# Patient Record
Sex: Male | Born: 1940 | Race: White | Hispanic: No | Marital: Married | Smoking: Current every day smoker
Health system: Southern US, Community
[De-identification: ages and names within clinical notes are randomized; demographics above are authoritative.]

## PROBLEM LIST (undated history)

## (undated) DIAGNOSIS — Z720 Tobacco use: Secondary | ICD-10-CM

## (undated) DIAGNOSIS — I10 Essential (primary) hypertension: Secondary | ICD-10-CM

## (undated) DIAGNOSIS — M4800 Spinal stenosis, site unspecified: Secondary | ICD-10-CM

## (undated) DIAGNOSIS — E785 Hyperlipidemia, unspecified: Secondary | ICD-10-CM

## (undated) DIAGNOSIS — I2510 Atherosclerotic heart disease of native coronary artery without angina pectoris: Secondary | ICD-10-CM

## (undated) DIAGNOSIS — E119 Type 2 diabetes mellitus without complications: Secondary | ICD-10-CM

## (undated) DIAGNOSIS — J449 Chronic obstructive pulmonary disease, unspecified: Secondary | ICD-10-CM

## (undated) HISTORY — PX: OTHER SURGICAL HISTORY: SHX169

## (undated) HISTORY — PX: CARDIAC CATHETERIZATION: SHX172

---

## 2006-03-02 ENCOUNTER — Ambulatory Visit: Payer: Self-pay | Admitting: Internal Medicine

## 2006-03-03 ENCOUNTER — Ambulatory Visit: Payer: Self-pay | Admitting: Internal Medicine

## 2006-03-19 ENCOUNTER — Inpatient Hospital Stay (HOSPITAL_COMMUNITY): Admission: RE | Admit: 2006-03-19 | Discharge: 2006-03-21 | Payer: Self-pay | Admitting: Neurosurgery

## 2007-11-29 IMAGING — CR DG CERVICAL SPINE 2 OR 3 VIEWS
1 series · 1 of 1 positions shown · non-contrast
Comparison: none

CLINICAL DATA: C3-4 HNP.  Anterior fusion. 
 CERVICAL SPINE ? 3 VIEW:

[view not recorded]
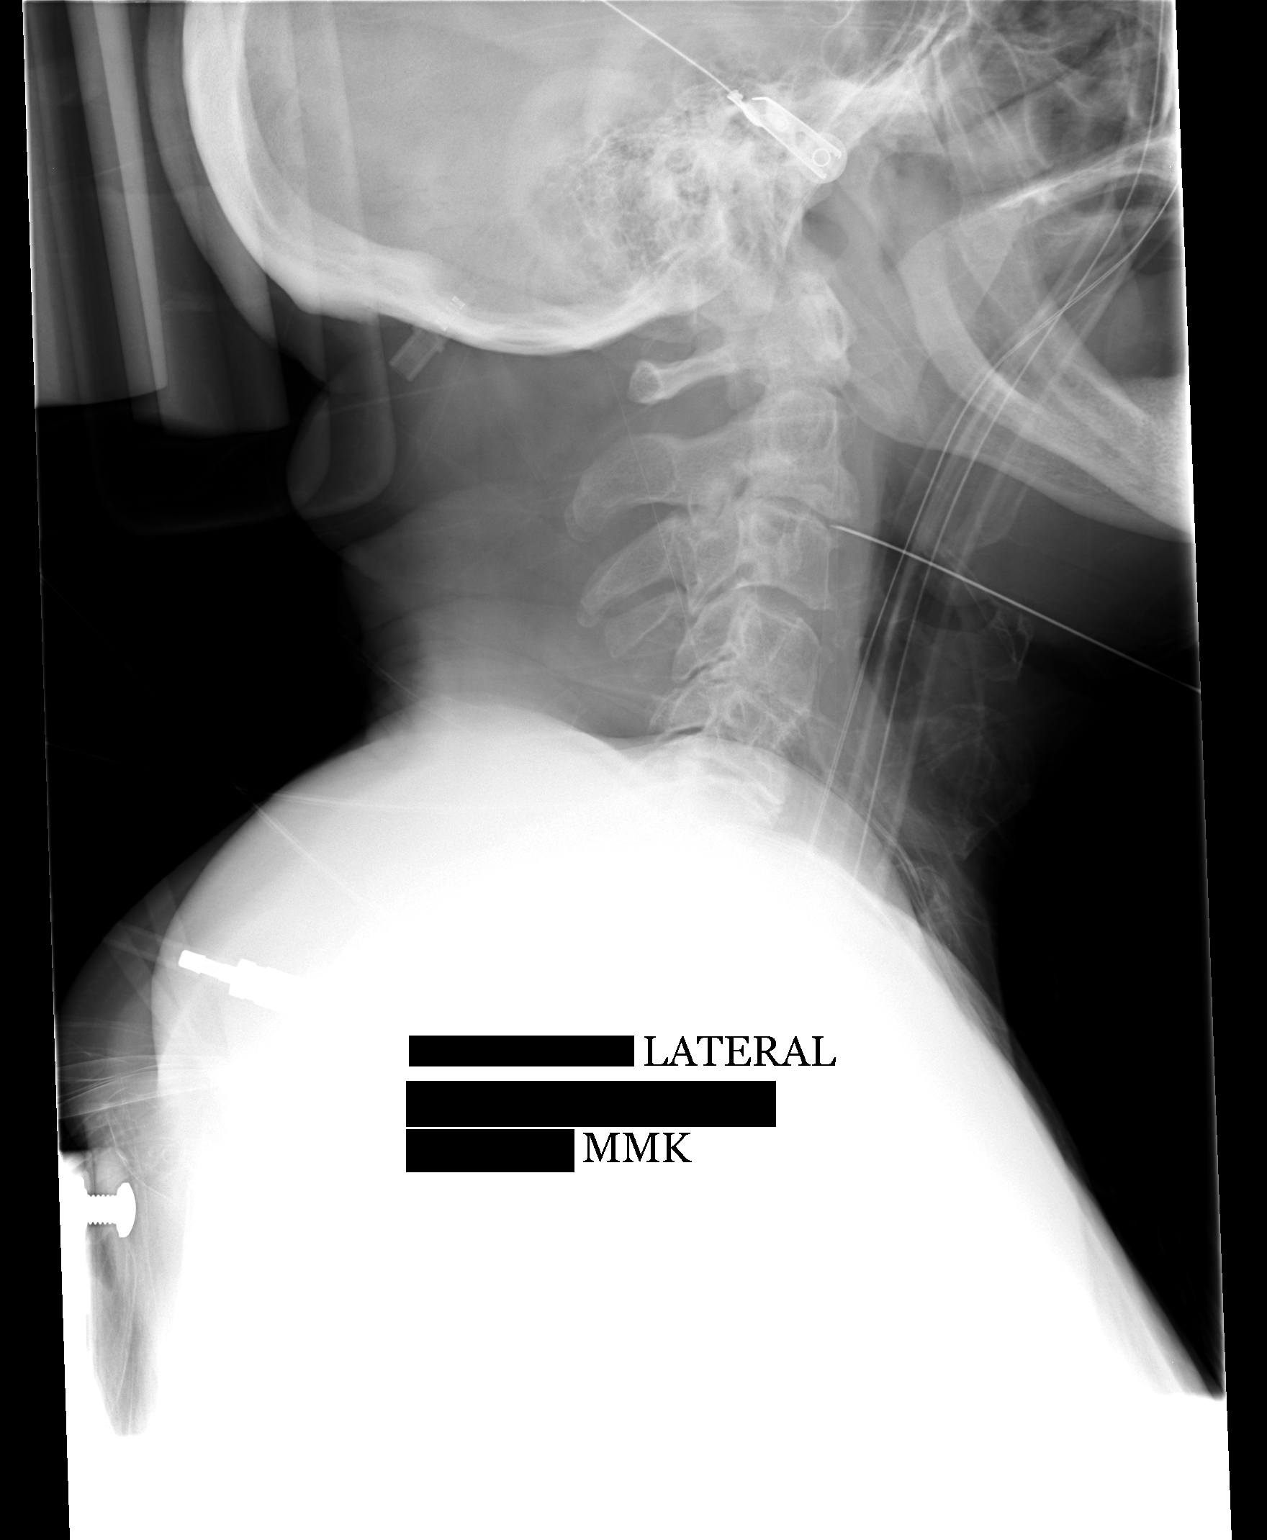

[1 of 1 positions shown; findings below may reference images not displayed]

FINDINGS: Three intraoperative lateral views of the cervical spine are submitted postoperatively for interpretation. 
 The first film demonstrates anterior probe overlying the C2-3 interspace. 
 The second film demonstrates anterior probe tip overlying the C3-4 interspace.  
 A third film demonstrates anterior plate and screw fixation and interbody fusion material at C3-4.
IMPRESSION: Anterior fusion C3-4 without definite complicating features.

## 2008-04-15 ENCOUNTER — Inpatient Hospital Stay: Payer: Self-pay | Admitting: *Deleted

## 2008-04-16 ENCOUNTER — Ambulatory Visit: Payer: Self-pay | Admitting: Internal Medicine

## 2011-05-15 ENCOUNTER — Ambulatory Visit: Payer: Self-pay | Admitting: Internal Medicine

## 2011-05-17 ENCOUNTER — Other Ambulatory Visit: Payer: Self-pay | Admitting: Internal Medicine

## 2011-05-17 LAB — CBC WITH DIFFERENTIAL/PLATELET
Basophil #: 0 10*3/uL
Basophil %: 0.4 %
Eosinophil #: 0.4 10*3/uL
Eosinophil %: 4 %
HCT: 39.1 % — ABNORMAL LOW
HGB: 13 g/dL
Lymphocyte %: 28.1 %
Lymphs Abs: 2.5 10*3/uL
MCH: 31 pg
MCHC: 33.4 g/dL
MCV: 93 fL
Monocyte #: 0.6 10*3/uL
Monocyte %: 6.6 %
Neutrophil #: 5.4 10*3/uL
Neutrophil %: 60.9 %
Platelet: 204 10*3/uL
RBC: 4.2 x10 6/mm 3 — ABNORMAL LOW
RDW: 13.4 %
WBC: 8.9 10*3/uL

## 2011-05-17 LAB — BASIC METABOLIC PANEL WITH GFR
Anion Gap: 9
BUN: 24 mg/dL — ABNORMAL HIGH
Calcium, Total: 8.9 mg/dL
Chloride: 106 mmol/L
Co2: 27 mmol/L
Creatinine: 1.75 mg/dL — ABNORMAL HIGH
EGFR (African American): 50 — ABNORMAL LOW
EGFR (Non-African Amer.): 41 — ABNORMAL LOW
Glucose: 118 mg/dL — ABNORMAL HIGH
Osmolality: 288
Potassium: 4.6 mmol/L
Sodium: 142 mmol/L

## 2011-05-18 ENCOUNTER — Ambulatory Visit: Payer: Self-pay | Admitting: Internal Medicine

## 2011-05-19 LAB — BASIC METABOLIC PANEL
Anion Gap: 10 (ref 7–16)
BUN: 17 mg/dL (ref 7–18)
Co2: 24 mmol/L (ref 21–32)
Creatinine: 1.16 mg/dL (ref 0.60–1.30)
EGFR (African American): >60
EGFR (Non-African Amer.): >60
Potassium: 4.3 mmol/L (ref 3.5–5.1)

## 2011-05-19 LAB — CK TOTAL AND CKMB (NOT AT ARMC): CK, Total: 529 U/L — ABNORMAL HIGH (ref 35–232)

## 2014-06-14 NOTE — Discharge Summary (Signed)
PATIENT NAME:  Pedro Carlson, Pedro Carlson MR#:  [id number] DATE OF BIRTH:  1940/05/10  DATE OF ADMISSION:  05/18/2011 DATE OF DISCHARGE:  05/19/2011  DISCHARGE DIAGNOSES:  1. Known coronary artery disease with unstable angina, PCI and drug-eluting stent of the left anterior descending artery.  2. Chronic renal insufficiency.   HISTORY: This is a 74 year old male with known coronary artery disease, hypertension, and hyperlipidemia having unstable angina, chest discomfort and underwent cardiac catheterization. At that time the patient had a significant left anterior descending artery stenosis which he received a drug-eluting stent without complication. He was placed on appropriate medications and watched overnight with no evidence of myocardial infarction or other EKG changes. The patient has felt quite well without any further symptoms and was ambulating well without need in adjustments of medications. He was discharged home in good condition.   DISCHARGE MEDICATIONS:  1. Metoprolol 25 mg b.i.d.  2. Aspirin 325 mg p.o. daily.  3. Metformin 1000 mg b.i.d.  4. Lisinopril 10 mg each day.  5. Simvastatin 40 mg p.o. daily.  6. Omeprazole 20 mg p.o. daily.   FOLLOW-UP: He is to follow-up with Dr. Dorothyann Pengwayne Callwood in two weeks for further treatment options and long-term cardiac care.   ____________________________ Lamar BlinksBruce J. Addisen Chappelle, MD bjk:rbg D: 06/07/2011 08:11:10 ET T: 06/08/2011 10:25:55 ET JOB#: [id number]  cc: Lamar BlinksBruce J. Chantell Kunkler, MD, <Dictator> Lamar BlinksBRUCE J Shirl Ludington MD ELECTRONICALLY SIGNED 06/13/2011 7:58

## 2014-07-24 ENCOUNTER — Emergency Department: Payer: Medicare Other

## 2014-07-24 ENCOUNTER — Inpatient Hospital Stay
Admission: EM | Admit: 2014-07-24 | Discharge: 2014-08-21 | DRG: 393 | Disposition: E | Payer: Medicare Other | Attending: Internal Medicine | Admitting: Internal Medicine

## 2014-07-24 ENCOUNTER — Encounter: Payer: Self-pay | Admitting: *Deleted

## 2014-07-24 DIAGNOSIS — G9340 Encephalopathy, unspecified: Secondary | ICD-10-CM | POA: Diagnosis not present

## 2014-07-24 DIAGNOSIS — R109 Unspecified abdominal pain: Secondary | ICD-10-CM

## 2014-07-24 DIAGNOSIS — K529 Noninfective gastroenteritis and colitis, unspecified: Secondary | ICD-10-CM | POA: Diagnosis present

## 2014-07-24 DIAGNOSIS — R001 Bradycardia, unspecified: Secondary | ICD-10-CM | POA: Diagnosis not present

## 2014-07-24 DIAGNOSIS — Z8711 Personal history of peptic ulcer disease: Secondary | ICD-10-CM | POA: Diagnosis not present

## 2014-07-24 DIAGNOSIS — N170 Acute kidney failure with tubular necrosis: Secondary | ICD-10-CM | POA: Diagnosis present

## 2014-07-24 DIAGNOSIS — I248 Other forms of acute ischemic heart disease: Secondary | ICD-10-CM | POA: Diagnosis present

## 2014-07-24 DIAGNOSIS — M4800 Spinal stenosis, site unspecified: Secondary | ICD-10-CM | POA: Diagnosis present

## 2014-07-24 DIAGNOSIS — E872 Acidosis: Secondary | ICD-10-CM | POA: Diagnosis not present

## 2014-07-24 DIAGNOSIS — K921 Melena: Secondary | ICD-10-CM | POA: Diagnosis present

## 2014-07-24 DIAGNOSIS — N183 Chronic kidney disease, stage 3 (moderate): Secondary | ICD-10-CM | POA: Diagnosis present

## 2014-07-24 DIAGNOSIS — Z79899 Other long term (current) drug therapy: Secondary | ICD-10-CM

## 2014-07-24 DIAGNOSIS — K567 Ileus, unspecified: Secondary | ICD-10-CM | POA: Diagnosis present

## 2014-07-24 DIAGNOSIS — R1110 Vomiting, unspecified: Secondary | ICD-10-CM | POA: Diagnosis present

## 2014-07-24 DIAGNOSIS — Z66 Do not resuscitate: Secondary | ICD-10-CM | POA: Diagnosis present

## 2014-07-24 DIAGNOSIS — Z7982 Long term (current) use of aspirin: Secondary | ICD-10-CM | POA: Diagnosis not present

## 2014-07-24 DIAGNOSIS — F17210 Nicotine dependence, cigarettes, uncomplicated: Secondary | ICD-10-CM | POA: Diagnosis present

## 2014-07-24 DIAGNOSIS — J441 Chronic obstructive pulmonary disease with (acute) exacerbation: Secondary | ICD-10-CM | POA: Diagnosis present

## 2014-07-24 DIAGNOSIS — A419 Sepsis, unspecified organism: Secondary | ICD-10-CM | POA: Diagnosis not present

## 2014-07-24 DIAGNOSIS — Z823 Family history of stroke: Secondary | ICD-10-CM | POA: Diagnosis not present

## 2014-07-24 DIAGNOSIS — I2510 Atherosclerotic heart disease of native coronary artery without angina pectoris: Secondary | ICD-10-CM | POA: Diagnosis present

## 2014-07-24 DIAGNOSIS — N179 Acute kidney failure, unspecified: Secondary | ICD-10-CM

## 2014-07-24 DIAGNOSIS — E119 Type 2 diabetes mellitus without complications: Secondary | ICD-10-CM | POA: Diagnosis present

## 2014-07-24 DIAGNOSIS — K559 Vascular disorder of intestine, unspecified: Secondary | ICD-10-CM | POA: Diagnosis present

## 2014-07-24 DIAGNOSIS — E785 Hyperlipidemia, unspecified: Secondary | ICD-10-CM | POA: Diagnosis present

## 2014-07-24 DIAGNOSIS — E860 Dehydration: Secondary | ICD-10-CM | POA: Diagnosis present

## 2014-07-24 DIAGNOSIS — K922 Gastrointestinal hemorrhage, unspecified: Secondary | ICD-10-CM

## 2014-07-24 DIAGNOSIS — E873 Alkalosis: Secondary | ICD-10-CM | POA: Diagnosis not present

## 2014-07-24 DIAGNOSIS — I129 Hypertensive chronic kidney disease with stage 1 through stage 4 chronic kidney disease, or unspecified chronic kidney disease: Secondary | ICD-10-CM | POA: Diagnosis present

## 2014-07-24 DIAGNOSIS — Z794 Long term (current) use of insulin: Secondary | ICD-10-CM

## 2014-07-24 DIAGNOSIS — R197 Diarrhea, unspecified: Secondary | ICD-10-CM | POA: Diagnosis present

## 2014-07-24 DIAGNOSIS — Z955 Presence of coronary angioplasty implant and graft: Secondary | ICD-10-CM

## 2014-07-24 DIAGNOSIS — R6521 Severe sepsis with septic shock: Secondary | ICD-10-CM | POA: Diagnosis not present

## 2014-07-24 DIAGNOSIS — R0602 Shortness of breath: Secondary | ICD-10-CM

## 2014-07-24 DIAGNOSIS — J9601 Acute respiratory failure with hypoxia: Secondary | ICD-10-CM | POA: Diagnosis not present

## 2014-07-24 DIAGNOSIS — R064 Hyperventilation: Secondary | ICD-10-CM

## 2014-07-24 DIAGNOSIS — R0682 Tachypnea, not elsewhere classified: Secondary | ICD-10-CM

## 2014-07-24 HISTORY — DX: Hyperlipidemia, unspecified: E78.5

## 2014-07-24 HISTORY — DX: Chronic obstructive pulmonary disease, unspecified: J44.9

## 2014-07-24 HISTORY — DX: Essential (primary) hypertension: I10

## 2014-07-24 HISTORY — DX: Tobacco use: Z72.0

## 2014-07-24 HISTORY — DX: Type 2 diabetes mellitus without complications: E11.9

## 2014-07-24 HISTORY — DX: Atherosclerotic heart disease of native coronary artery without angina pectoris: I25.10

## 2014-07-24 HISTORY — DX: Spinal stenosis, site unspecified: M48.00

## 2014-07-24 LAB — BASIC METABOLIC PANEL
ANION GAP: 19 — AB (ref 5–15)
BUN: 52 mg/dL — ABNORMAL HIGH (ref 6–20)
CALCIUM: 10.1 mg/dL (ref 8.9–10.3)
CHLORIDE: 95 mmol/L — AB (ref 101–111)
CO2: 26 mmol/L (ref 22–32)
CREATININE: 2.73 mg/dL — AB (ref 0.61–1.24)
GFR calc Af Amer: 25 mL/min — ABNORMAL LOW (ref >60)
GFR calc non Af Amer: 22 mL/min — ABNORMAL LOW (ref >60)
GLUCOSE: 250 mg/dL — AB (ref 65–99)
Potassium: 4.6 mmol/L (ref 3.5–5.1)
SODIUM: 140 mmol/L (ref 135–145)

## 2014-07-24 LAB — CBC WITH DIFFERENTIAL/PLATELET
BASOS ABS: 0 10*3/uL (ref 0–0.1)
Basophils Relative: 0 %
EOS PCT: 0 %
Eosinophils Absolute: 0 10*3/uL (ref 0–0.7)
HEMATOCRIT: 44.9 % (ref 40.0–52.0)
HEMOGLOBIN: 14.3 g/dL (ref 13.0–18.0)
Lymphocytes Relative: 16 %
Lymphs Abs: 1.4 10*3/uL (ref 1.0–3.6)
MCH: 29.3 pg (ref 26.0–34.0)
MCHC: 31.9 g/dL — AB (ref 32.0–36.0)
MCV: 91.9 fL (ref 80.0–100.0)
Monocytes Absolute: 1 10*3/uL (ref 0.2–1.0)
Monocytes Relative: 10 %
NEUTROS ABS: 6.8 10*3/uL — AB (ref 1.4–6.5)
Neutrophils Relative %: 74 %
PLATELETS: 287 10*3/uL (ref 150–440)
RBC: 4.89 MIL/uL (ref 4.40–5.90)
RDW: 14.9 % — ABNORMAL HIGH (ref 11.5–14.5)
WBC: 9.3 10*3/uL (ref 3.8–10.6)

## 2014-07-24 LAB — TROPONIN I: Troponin I: 0.08 ng/mL — ABNORMAL HIGH (ref <0.031)

## 2014-07-24 LAB — C DIFFICILE QUICK SCREEN W PCR REFLEX
C DIFFICILE (CDIFF) INTERP: NEGATIVE
C DIFFICILE (CDIFF) TOXIN: NEGATIVE
C Diff antigen: NEGATIVE

## 2014-07-24 LAB — GLUCOSE, CAPILLARY
GLUCOSE-CAPILLARY: 207 mg/dL — AB (ref 65–99)
Glucose-Capillary: 263 mg/dL — ABNORMAL HIGH (ref 65–99)

## 2014-07-24 LAB — BRAIN NATRIURETIC PEPTIDE: B NATRIURETIC PEPTIDE 5: 264 pg/mL — AB (ref 0.0–100.0)

## 2014-07-24 MED ORDER — ASPIRIN 81 MG PO CHEW
324.0000 mg | CHEWABLE_TABLET | Freq: Once | ORAL | Status: AC
  Administered 2014-07-24: 324 mg via ORAL

## 2014-07-24 MED ORDER — DIAZEPAM 2 MG PO TABS
10.0000 mg | ORAL_TABLET | Freq: Every day | ORAL | Status: DC
  Administered 2014-07-24: 23:00:00 10 mg via ORAL
  Filled 2014-07-24: qty 2

## 2014-07-24 MED ORDER — METHYLPREDNISOLONE SODIUM SUCC 125 MG IJ SOLR
60.0000 mg | INTRAMUSCULAR | Status: DC
  Administered 2014-07-24: 60 mg via INTRAVENOUS
  Filled 2014-07-24: qty 2

## 2014-07-24 MED ORDER — SIMVASTATIN 40 MG PO TABS
40.0000 mg | ORAL_TABLET | Freq: Every day | ORAL | Status: DC
  Administered 2014-07-24 – 2014-07-25 (×2): 40 mg via ORAL
  Filled 2014-07-24 (×2): qty 1

## 2014-07-24 MED ORDER — SODIUM CHLORIDE 0.9 % IV BOLUS (SEPSIS): 500.0000 mL | Freq: Once | INTRAVENOUS | Status: AC

## 2014-07-24 MED ORDER — ONDANSETRON HCL 4 MG PO TABS: 4.0000 mg | ORAL_TABLET | ORAL | Status: DC | PRN

## 2014-07-24 MED ORDER — MONTELUKAST SODIUM 10 MG PO TABS
10.0000 mg | ORAL_TABLET | Freq: Every day | ORAL | Status: DC
  Administered 2014-07-24 – 2014-07-25 (×2): 10 mg via ORAL
  Filled 2014-07-24 (×2): qty 1

## 2014-07-24 MED ORDER — LORATADINE 10 MG PO TABS
10.0000 mg | ORAL_TABLET | Freq: Every day | ORAL | Status: DC
  Administered 2014-07-24 – 2014-07-25 (×2): 10 mg via ORAL
  Filled 2014-07-24 (×3): qty 1

## 2014-07-24 MED ORDER — ONDANSETRON HCL 4 MG/2ML IJ SOLN: 4.0000 mg | Freq: Four times a day (QID) | INTRAMUSCULAR | Status: DC | PRN

## 2014-07-24 MED ORDER — PANTOPRAZOLE SODIUM 40 MG PO TBEC
40.0000 mg | DELAYED_RELEASE_TABLET | Freq: Every day | ORAL | Status: DC
  Administered 2014-07-24 – 2014-07-25 (×2): 40 mg via ORAL
  Filled 2014-07-24 (×2): qty 1

## 2014-07-24 MED ORDER — PROMETHAZINE HCL 25 MG/ML IJ SOLN
12.5000 mg | Freq: Once | INTRAMUSCULAR | Status: AC
  Administered 2014-07-24: 12.5 mg via INTRAVENOUS
  Filled 2014-07-24: qty 1

## 2014-07-24 MED ORDER — ENOXAPARIN SODIUM 30 MG/0.3ML ~~LOC~~ SOLN
30.0000 mg | SUBCUTANEOUS | Status: DC
  Administered 2014-07-24: 17:00:00 30 mg via SUBCUTANEOUS
  Filled 2014-07-24: qty 0.3

## 2014-07-24 MED ORDER — ASPIRIN 81 MG PO CHEW
CHEWABLE_TABLET | ORAL | Status: AC
  Administered 2014-07-24: 324 mg via ORAL
  Filled 2014-07-24: qty 4

## 2014-07-24 MED ORDER — LISINOPRIL 20 MG PO TABS
20.0000 mg | ORAL_TABLET | Freq: Every day | ORAL | Status: DC
  Administered 2014-07-24 – 2014-07-25 (×2): 20 mg via ORAL
  Filled 2014-07-24 (×2): qty 1

## 2014-07-24 MED ORDER — ONDANSETRON HCL 4 MG/2ML IJ SOLN
4.0000 mg | Freq: Four times a day (QID) | INTRAMUSCULAR | Status: DC | PRN
  Administered 2014-07-24: 4 mg via INTRAVENOUS
  Filled 2014-07-24: qty 2

## 2014-07-24 MED ORDER — IPRATROPIUM-ALBUTEROL 0.5-2.5 (3) MG/3ML IN SOLN
RESPIRATORY_TRACT | Status: AC
  Administered 2014-07-24: 3 mL via RESPIRATORY_TRACT
  Filled 2014-07-24: qty 3

## 2014-07-24 MED ORDER — ASPIRIN EC 81 MG PO TBEC
81.0000 mg | DELAYED_RELEASE_TABLET | Freq: Every day | ORAL | Status: DC
  Administered 2014-07-25: 10:00:00 81 mg via ORAL
  Filled 2014-07-24: qty 1

## 2014-07-24 MED ORDER — PRASUGREL HCL 10 MG PO TABS
10.0000 mg | ORAL_TABLET | Freq: Every day | ORAL | Status: DC
  Administered 2014-07-24: 17:00:00 10 mg via ORAL
  Filled 2014-07-24 (×3): qty 1

## 2014-07-24 MED ORDER — SODIUM CHLORIDE 0.9 % IV SOLN
INTRAVENOUS | Status: DC
  Administered 2014-07-24 – 2014-07-25 (×2): via INTRAVENOUS

## 2014-07-24 MED ORDER — ACETAMINOPHEN 650 MG RE SUPP: 650.0000 mg | Freq: Four times a day (QID) | RECTAL | Status: DC | PRN

## 2014-07-24 MED ORDER — INSULIN ASPART 100 UNIT/ML ~~LOC~~ SOLN
0.0000 [IU] | Freq: Every day | SUBCUTANEOUS | Status: DC
  Administered 2014-07-24: 2 [IU] via SUBCUTANEOUS
  Filled 2014-07-24: qty 2

## 2014-07-24 MED ORDER — SODIUM CHLORIDE 0.9 % IV BOLUS (SEPSIS)
500.0000 mL | Freq: Once | INTRAVENOUS | Status: AC
  Administered 2014-07-24: 500 mL via INTRAVENOUS

## 2014-07-24 MED ORDER — METOPROLOL TARTRATE 50 MG PO TABS
50.0000 mg | ORAL_TABLET | Freq: Two times a day (BID) | ORAL | Status: DC
  Administered 2014-07-24 – 2014-07-25 (×2): 50 mg via ORAL
  Filled 2014-07-24 (×2): qty 1

## 2014-07-24 MED ORDER — INSULIN GLARGINE 100 UNIT/ML ~~LOC~~ SOLN
7.0000 [IU] | Freq: Every day | SUBCUTANEOUS | Status: DC
  Administered 2014-07-24: 23:00:00 7 [IU] via SUBCUTANEOUS
  Filled 2014-07-24 (×2): qty 0.07

## 2014-07-24 MED ORDER — INSULIN ASPART 100 UNIT/ML ~~LOC~~ SOLN
0.0000 [IU] | Freq: Three times a day (TID) | SUBCUTANEOUS | Status: DC
  Administered 2014-07-24: 8 [IU] via SUBCUTANEOUS
  Administered 2014-07-25: 5 [IU] via SUBCUTANEOUS
  Administered 2014-07-25: 8 [IU] via SUBCUTANEOUS
  Filled 2014-07-24: qty 8
  Filled 2014-07-24 (×2): qty 5
  Filled 2014-07-24: qty 8

## 2014-07-24 MED ORDER — IPRATROPIUM-ALBUTEROL 0.5-2.5 (3) MG/3ML IN SOLN
3.0000 mL | Freq: Four times a day (QID) | RESPIRATORY_TRACT | Status: DC
  Administered 2014-07-24 – 2014-07-25 (×3): 3 mL via RESPIRATORY_TRACT
  Filled 2014-07-24 (×2): qty 3

## 2014-07-24 MED ORDER — ONDANSETRON HCL 4 MG PO TABS: 4.0000 mg | ORAL_TABLET | Freq: Four times a day (QID) | ORAL | Status: DC | PRN

## 2014-07-24 MED ORDER — METHYLPREDNISOLONE SODIUM SUCC 125 MG IJ SOLR
INTRAMUSCULAR | Status: AC
  Administered 2014-07-24: 125 mg via INTRAVENOUS
  Filled 2014-07-24: qty 2

## 2014-07-24 MED ORDER — IPRATROPIUM-ALBUTEROL 0.5-2.5 (3) MG/3ML IN SOLN
3.0000 mL | Freq: Once | RESPIRATORY_TRACT | Status: AC
  Administered 2014-07-24: 3 mL via RESPIRATORY_TRACT

## 2014-07-24 MED ORDER — UMECLIDINIUM-VILANTEROL 62.5-25 MCG/INH IN AEPB
1.0000 | INHALATION_SPRAY | Freq: Every day | RESPIRATORY_TRACT | Status: DC
  Administered 2014-07-25: 10:00:00 via RESPIRATORY_TRACT

## 2014-07-24 MED ORDER — LOPERAMIDE HCL 2 MG PO CAPS
2.0000 mg | ORAL_CAPSULE | Freq: Four times a day (QID) | ORAL | Status: DC | PRN
  Administered 2014-07-25: 10:00:00 2 mg via ORAL
  Filled 2014-07-24: qty 1

## 2014-07-24 MED ORDER — METHYLPREDNISOLONE SODIUM SUCC 125 MG IJ SOLR
125.0000 mg | Freq: Once | INTRAMUSCULAR | Status: AC
  Administered 2014-07-24: 125 mg via INTRAVENOUS

## 2014-07-24 MED ORDER — ACETAMINOPHEN 325 MG PO TABS
650.0000 mg | ORAL_TABLET | Freq: Four times a day (QID) | ORAL | Status: DC | PRN
Start: 2014-07-24 — End: 2014-07-26

## 2014-07-24 NOTE — ED Provider Notes (Signed)
[facility] Emergency Department Provider Note   ____________________________________________  Time seen: Arrival by EMS 11:10 AM I have reviewed the triage vital signs and the triage nursing note.  HISTORY  Chief Complaint Shortness of Breath   Historian  Patient  HPI Pedro Carlson is a 74 y.o. male who called EMS due to respiratory distress. He does have a history of COPD and uses inhalers. He had moderate reported wheezing which has improved somewhat after DuoNeb treatment by EMS prehospital. He has had 3 days of nausea vomiting and diarrhea, which has eased off. He's had no fever.    Past Medical History  Diagnosis Date  . COPD (chronic obstructive pulmonary disease)   . Diabetes mellitus without complication     There are no active problems to display for this patient.   Past Surgical History  Procedure Laterality Date  . Heart stent      Current Outpatient Rx  Name  Route  Sig  Dispense  Refill  . aspirin EC 81 MG tablet   Oral   Take 81 mg by mouth daily.         . diazepam (VALIUM) 10 MG tablet   Oral   Take 10 mg by mouth at bedtime.         Marland Kitchen. lisinopril (PRINIVIL,ZESTRIL) 20 MG tablet   Oral   Take 20 mg by mouth daily.         Marland Kitchen. loratadine (CLARITIN) 10 MG tablet   Oral   Take 10 mg by mouth daily.         . metFORMIN (GLUCOPHAGE) 1000 MG tablet   Oral   Take 1,000 mg by mouth 2 (two) times daily.         . metoprolol (LOPRESSOR) 50 MG tablet   Oral   Take 50 mg by mouth 2 (two) times daily.         . montelukast (SINGULAIR) 10 MG tablet   Oral   Take 10 mg by mouth daily.         Marland Kitchen. omeprazole (PRILOSEC) 20 MG capsule   Oral   Take 20 mg by mouth daily.         . prasugrel (EFFIENT) 10 MG TABS tablet   Oral   Take 10 mg by mouth daily.         . simvastatin (ZOCOR) 40 MG tablet   Oral   Take 40 mg by mouth daily.           Allergies Review of patient's allergies indicates no known  allergies.  History reviewed. No pertinent family history.  Social History History  Substance Use Topics  . Smoking status: Current Every Day Smoker  . Smokeless tobacco: Not on file  . Alcohol Use: Not on file    Review of Systems  Constitutional: Negative for fever. Eyes: Negative for visual changes. ENT: Negative for sore throat. Cardiovascular: Negative for chest pain. Respiratory: Negative for shortness of breath. Gastrointestinal: Positive for vomiting and diarrhea. Genitourinary: Negative for dysuria. Musculoskeletal: Negative for back pain. Skin: Negative for rash. Neurological: Negative for headaches, focal weakness or numbness.  ____________________________________________   PHYSICAL EXAM:  VITAL SIGNS: ED Triage Vitals  Enc Vitals Group     BP 11/18/2014 1109 132/86 mmHg     Pulse Rate 11/18/2014 1109 111     Resp 11/18/2014 1109 20     Temp 11/18/2014 1109 98.9 F (37.2 C)     Temp Source 11/18/2014 1109  Oral     SpO2 08/02/2014 1109 95 %     Weight 08/02/2014 1109 152 lb (68.947 kg)     Height 07/30/2014 1109 6' (1.829 m)     Head Cir --      Peak Flow --      Pain Score 08/18/2014 1110 3     Pain Loc --      Pain Edu? --      Excl. in GC? --      Constitutional: Alert and oriented. Well appearing and in no distress. Eyes: Conjunctivae are normal. PERRL. Normal extraocular movements. ENT   Head: Normocephalic and atraumatic.   Nose: No congestion/rhinnorhea.   Mouth/Throat: Mucous membranes are moist.   Neck: No stridor. Cardiovascular: Normal rate, regular rhythm.  No murmurs, rubs, or gallops. Respiratory: Mild wheezes all fields, no tachypnea or retractions. Gastrointestinal: Soft and nontender. No distention.  Genitourinary: Deferred Musculoskeletal: Nontender with normal range of motion in all extremities. No joint effusions.  No lower extremity tenderness nor edema. Neurologic:  Normal speech and language. No gross focal neurologic deficits  are appreciated. Skin:  Skin is warm, dry and intact. No rash noted. Psychiatric: Mood and affect are normal. Speech and behavior are normal. Patient exhibits appropriate insight and judgment.  ____________________________________________   EKG I, Governor Rooks, MD, the attending physician have personally viewed and interpreted this ECG.  109 bpm. Sinus tachycardia. Narrow QRS. Normal axis. Nonspecific ST-T wave. ____________________________________________  LABS (pertinent positives/negatives)  C. difficile negative BUN 52 and creatinine 2.73 Glucose 250 BNP 264 Troponin 0.08 CBC within normal limits  ____________________________________________  RADIOLOGY Radiologist results reviewed  Chest x-ray: No active pulmonary disease  __________________________________________  PROCEDURES  Procedure(s) performed: None Critical Care performed: None  ____________________________________________   ED COURSE / ASSESSMENT AND PLAN  Pertinent labs & imaging results that were available during my care of the patient were reviewed by me and considered in my medical decision making (see chart for details).  Patient clinically is having a COPD exacerbation and was treated with DuoNeb prior to arrival and I added a slight Medrol. When his troponin came back slightly elevated at 0.08, he was given 4 baby aspirins. His EKG is nonspecific. However he will need observation admission for evaluation of the shortness of breath with the abnormal EKG and troponin. He is not having any ongoing chest pain and was not given any further antibiotic relation as I suspect this is more likely a demand ischemia than acute coronary occlusion.  Patient's renal function is decreased consistent with acute renal failure in the setting of likely dehydration due to recent vomiting and diarrhea. Patient is being slowly hydrated given also possible history of congestive heart failure.  On-call hospitalist was  consulted for hospitalist admission.   ___________________________________________   FINAL CLINICAL IMPRESSION(S) / ED DIAGNOSES   Final diagnoses:  COPD with acute exacerbation  Acute renal failure, unspecified acute renal failure type      Governor Rooks, MD 07/26/2014 1342

## 2014-07-24 NOTE — Progress Notes (Signed)
Dr. Clint GuyHower notified of patients nausea not improved after PRN zofran. MD to enter new order for Phenergan, please see orders.

## 2014-07-24 NOTE — ED Notes (Signed)
Pt changed and cleaned.

## 2014-07-24 NOTE — ED Notes (Signed)
Pt arrives via EMS with complaints of SOB, N/V x 3 days, pt states an episode of diaherra today, EMS gave 1 duoneb and 4mg  Zofran, pt awake and alert upon arrival

## 2014-07-24 NOTE — ED Notes (Signed)
Pt had large episode of liquid stool, pt cleaned and sample collected, bed changed, Dr. Shaune PollackLord notified

## 2014-07-24 NOTE — Plan of Care (Signed)
Problem: Discharge Progression Outcomes Goal: Discharge plan in place and appropriate Individualization Outcome: Progressing Address patient as [name]. Patient is from home, lives with wife. Patient is a smoker. Smokes 1/2 pack of cigarettes daily. Patient is a high falls risk, offer toileting every hour. Patient has history of COPD, DM, HTN and CAD controlled with home medication. Goal: Other Discharge Outcomes/Goals Outcome: Progressing Plan of Care Progressing to Goal: Patient has no complaints of pain. Receiving IV fluids. Tolerated diet well. Continues with diarrhea.

## 2014-07-24 NOTE — ED Notes (Signed)
Dr. Shaune PollackLord notified of trop of 0.08

## 2014-07-24 NOTE — H&P (Signed)
[facility] [place] at [facility]   PATIENT NAME: Pedro Carlson    MR#:  [id number]  DATE OF BIRTH:  Mar 10, 1940  DATE OF ADMISSION:  07/28/2014  PRIMARY CARE PHYSICIAN: Jaclyn Shaggy, MD   REQUESTING/REFERRING PHYSICIAN: Governor Rooks  CHIEF COMPLAINT:   Chief Complaint  Patient presents with  . Shortness of Breath  Diarrhea HISTORY OF PRESENT ILLNESS:  JAQUESE Carlson is a 74 y.o. Male with HTN, DM, CAD, COPD, Tobacco abuse who called EMS due to respiratory distress. He does have a history of COPD and uses inhalers. He had moderate reported wheezing which has improved somewhat after DuoNeb treatment by EMS prehospital. He has had 2 days of nausea vomiting and diarrhea. He's had no fever but had chills. No blood in vomiting or diarrhea. 3 episodes of watery diarrhea here in the emergency room.   breathing is improved after Solu-Medrol and DuoNeb.  Found to have acute renal failure with severe dehydration from his vomiting and diarrhea. No recent antibiotics use. Mild on and off abdominal pain, presently pain-free. C. difficile checked in the emergency room and negative.  PAST MEDICAL HISTORY:   Past Medical History  Diagnosis Date  . COPD (chronic obstructive pulmonary disease)   . Diabetes mellitus without complication   . Hypertension   . CAD (coronary artery disease)   . Spinal stenosis   . Tobacco abuse   . Hyperlipidemia     PAST SURGICAL HISTORY:   Past Surgical History  Procedure Laterality Date  . Heart stent    . Cardiac catheterization      SOCIAL HISTORY:   History  Substance Use Topics  . Smoking status: Current Every Day Smoker    Types: Cigarettes  . Smokeless tobacco: Not on file  . Alcohol Use: No    FAMILY HISTORY:   Family History  Problem Relation Age of Onset  . CVA Father     DRUG ALLERGIES:  No Known Allergies  REVIEW OF SYSTEMS:   Review of Systems  Constitutional: Positive for chills and  malaise/fatigue. Negative for fever and weight loss.  HENT: Negative for hearing loss and nosebleeds.   Eyes: Negative for blurred vision, double vision and pain.  Respiratory: Positive for cough, shortness of breath and wheezing. Negative for hemoptysis and sputum production.   Cardiovascular: Negative for chest pain, palpitations, orthopnea and leg swelling.  Gastrointestinal: Positive for nausea, vomiting, abdominal pain and diarrhea. Negative for constipation.  Genitourinary: Negative for dysuria and hematuria.  Musculoskeletal: Negative for myalgias, back pain and falls.  Skin: Negative for rash.  Neurological: Negative for dizziness, tremors, sensory change, speech change, focal weakness, seizures and headaches.  Endo/Heme/Allergies: Does not bruise/bleed easily.  Psychiatric/Behavioral: Negative for depression and memory loss. The patient is not nervous/anxious.     MEDICATIONS AT HOME:   Prior to Admission medications   Medication Sig Start Date End Date Taking? Authorizing Provider  albuterol (PROVENTIL HFA;VENTOLIN HFA) 108 (90 BASE) MCG/ACT inhaler Inhale 1 puff into the lungs every 6 (six) hours as needed for shortness of breath.   Yes Historical Provider, MD  aspirin EC 81 MG tablet Take 81 mg by mouth daily.   Yes Historical Provider, MD  diazepam (VALIUM) 10 MG tablet Take 10 mg by mouth at bedtime.   Yes Historical Provider, MD  insulin glargine (LANTUS) 100 UNIT/ML injection Inject 7 Units into the skin at bedtime.   Yes Historical Provider, MD  lisinopril (PRINIVIL,ZESTRIL) 20 MG tablet Take 20 mg  by mouth daily.   Yes Historical Provider, MD  loratadine (CLARITIN) 10 MG tablet Take 10 mg by mouth daily.   Yes Historical Provider, MD  metFORMIN (GLUCOPHAGE) 1000 MG tablet Take 1,000 mg by mouth 2 (two) times daily.   Yes Historical Provider, MD  metoprolol (LOPRESSOR) 50 MG tablet Take 50 mg by mouth 2 (two) times daily.   Yes Historical Provider, MD  montelukast  (SINGULAIR) 10 MG tablet Take 10 mg by mouth daily.   Yes Historical Provider, MD  omeprazole (PRILOSEC) 20 MG capsule Take 20 mg by mouth daily.   Yes Historical Provider, MD  prasugrel (EFFIENT) 10 MG TABS tablet Take 10 mg by mouth daily.   Yes Historical Provider, MD  simvastatin (ZOCOR) 40 MG tablet Take 40 mg by mouth daily.   Yes Historical Provider, MD  Umeclidinium-Vilanterol 62.5-25 MCG/INH AEPB Inhale 1 puff into the lungs daily.   Yes Historical Provider, MD      VITAL SIGNS:  Blood pressure 103/60, pulse 105, temperature 98.9 F (37.2 C), temperature source Oral, resp. rate 40, height 6' (1.829 m), weight 68.947 kg (152 lb), SpO2 95 %.  PHYSICAL EXAMINATION:  Physical Exam  GENERAL:  74 y.o.-year-old patient lying in the bed with no acute distress.  EYES: Pupils equal, round, reactive to light and accommodation. No scleral icterus. Extraocular muscles intact.  HEENT: Head atraumatic, normocephalic. Oropharynx and nasopharynx clear. No oropharyngeal erythema, Dry oral mucosa NECK:  Supple, no jugular venous distention. No thyroid enlargement, no tenderness.  LUNGS: Normal breath sounds bilaterally, rales, rhonchi. No use of accessory muscles of respiration. Expiratory weezing. CARDIOVASCULAR: S1, S2 normal. No murmurs, rubs, or gallops.  ABDOMEN: Soft, nondistended. Bowel sounds present. No organomegaly or mass. Mild diffuse tenderness. EXTREMITIES: No pedal edema, cyanosis, or clubbing. + 2 pedal & radial pulses b/l.   NEUROLOGIC: Cranial nerves II through XII are intact. No focal Motor or sensory deficits appreciated b/l PSYCHIATRIC: The patient is alert and oriented x 3. Good affect.  SKIN: No obvious rash, lesion, or ulcer.   LABORATORY PANEL:   CBC  Recent Labs Lab 08/11/2014 1132  WBC 9.3  HGB 14.3  HCT 44.9  PLT 287   ------------------------------------------------------------------------------------------------------------------  Chemistries   Recent  Labs Lab 08/15/2014 1132  NA 140  K 4.6  CL 95*  CO2 26  GLUCOSE 250*  BUN 52*  CREATININE 2.73*  CALCIUM 10.1   ------------------------------------------------------------------------------------------------------------------  Cardiac Enzymes  Recent Labs Lab 08/01/2014 1132  TROPONINI 0.08*   ------------------------------------------------------------------------------------------------------------------  RADIOLOGY:  Dg Chest 2 View  08/18/2014   CLINICAL DATA:  Shortness of breath.  EXAM: CHEST  2 VIEW  COMPARISON:  None.  FINDINGS: The heart size and mediastinal contours are within normal limits. Both lungs are clear. No pneumothorax or pleural effusion is noted. 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  IMPRESSION: No active cardiopulmonary disease.   Electronically Signed   By: Lupita RaiderJames  Green Jr, M.D.   On: 08/18/2014 13:02     IMPRESSION AND PLAN:   * Acute renal failure, pre renal from dehydration Due to vomiting, diarrhea and decreased intake. Bolus 500 mL of normal saline now. Put him on continuous IV fluid resuscitation. Monitor input and output. We will repeat a BUN and creatinine tomorrow morning. He does have a baseline CKD stage III and baseline creatinine is 1.4-1.5.  * Gastroenteritis  C. difficile is negative. We will send for stool cultures. No acute abdomen signs. Will need further investigation  if symptoms don't resolve over the next 24-48 hours.  * COPD exacerbation  improved with IV steroid-induced and nebulizers in the emergency room will continue him on daily steroids, nebulizers and home inhalers.  * Hypertension  continue home medications  * Diabetes mellitus Lantus, SSI, ADA.  * Coronary artery disease 0.08 troponin. Repeat troponin. No Chest pain or EKG changes. Likely due to demand ischemia in setting of COPD exacerbation and  ARF.  * DVT prophylaxis Lovenox, renally dosed  * Tobacco abuse Counseled to quit  smoking for greater than 3 minutes. Patient is not keen on quitting at this point.   All the records are reviewed and case discussed with ED provider. Management plans discussed with the patient, family and they are in agreement.  CODE STATUS: DNI. Ok to Resuscitate.   TOTAL TIME TAKING CARE OF THIS PATIENT: 45 minutes.    Milagros Loll R M.D on 08/05/14 at 2:46 PM  Between 7am to 6pm - Pager - [phone]  After 6pm go to [email or web address] - password [email or web address] [facility]  [facility]  Office  [phone]  CC: Primary care physician; Jaclyn Shaggy, MD

## 2014-07-25 ENCOUNTER — Inpatient Hospital Stay: Payer: Medicare Other

## 2014-07-25 DIAGNOSIS — K559 Vascular disorder of intestine, unspecified: Secondary | ICD-10-CM

## 2014-07-25 LAB — TROPONIN I: Troponin I: 0.14 ng/mL — ABNORMAL HIGH (ref <0.031)

## 2014-07-25 LAB — CBC
HEMATOCRIT: 39.9 % — AB (ref 40.0–52.0)
Hemoglobin: 12.8 g/dL — ABNORMAL LOW (ref 13.0–18.0)
MCH: 29.3 pg (ref 26.0–34.0)
MCHC: 32 g/dL (ref 32.0–36.0)
MCV: 91.6 fL (ref 80.0–100.0)
Platelets: 258 10*3/uL (ref 150–440)
RBC: 4.36 MIL/uL — ABNORMAL LOW (ref 4.40–5.90)
RDW: 14.7 % — ABNORMAL HIGH (ref 11.5–14.5)
WBC: 12.2 10*3/uL — ABNORMAL HIGH (ref 3.8–10.6)

## 2014-07-25 LAB — BASIC METABOLIC PANEL
ANION GAP: 19 — AB (ref 5–15)
BUN: 88 mg/dL — AB (ref 6–20)
CALCIUM: 8.8 mg/dL — AB (ref 8.9–10.3)
CHLORIDE: 92 mmol/L — AB (ref 101–111)
CO2: 26 mmol/L (ref 22–32)
Creatinine, Ser: 3.6 mg/dL — ABNORMAL HIGH (ref 0.61–1.24)
GFR calc Af Amer: 18 mL/min — ABNORMAL LOW (ref >60)
GFR, EST NON AFRICAN AMERICAN: 15 mL/min — AB (ref >60)
Glucose, Bld: 276 mg/dL — ABNORMAL HIGH (ref 65–99)
Potassium: 4.2 mmol/L (ref 3.5–5.1)
SODIUM: 137 mmol/L (ref 135–145)

## 2014-07-25 LAB — BLOOD GAS, ARTERIAL
ACID-BASE DEFICIT: 10.6 mmol/L — AB (ref 0.0–2.0)
ALLENS TEST (PASS/FAIL): POSITIVE — AB
Bicarbonate: 17.9 mEq/L — ABNORMAL LOW (ref 21.0–28.0)
FIO2: 100 %
O2 Saturation: 99.8 %
PATIENT TEMPERATURE: 37
PO2 ART: 268 mmHg — AB (ref 83.0–108.0)
pCO2 arterial: 49 mmHg — ABNORMAL HIGH (ref 32.0–48.0)
pH, Arterial: 7.17 — CL (ref 7.350–7.450)

## 2014-07-25 LAB — GLUCOSE, CAPILLARY
GLUCOSE-CAPILLARY: 261 mg/dL — AB (ref 65–99)
Glucose-Capillary: 220 mg/dL — ABNORMAL HIGH (ref 65–99)
Glucose-Capillary: 194 mg/dL — ABNORMAL HIGH (ref 65–99)
Glucose-Capillary: 203 mg/dL — ABNORMAL HIGH (ref 65–99)
Glucose-Capillary: 100 mg/dL — ABNORMAL HIGH (ref 65–99)
Glucose-Capillary: 110 mg/dL — ABNORMAL HIGH (ref 65–99)

## 2014-07-25 LAB — HEMOGLOBIN: HEMOGLOBIN: 12.3 g/dL — AB (ref 13.0–18.0)

## 2014-07-25 LAB — MRSA PCR SCREENING: MRSA BY PCR: NEGATIVE

## 2014-07-25 LAB — OCCULT BLOOD X 1 CARD TO LAB, STOOL: FECAL OCCULT BLD: POSITIVE — AB

## 2014-07-25 LAB — LACTIC ACID, PLASMA: Lactic Acid, Venous: 3.9 mmol/L (ref 0.5–2.0)

## 2014-07-25 LAB — LIPASE, BLOOD: LIPASE: 26 U/L (ref 22–51)

## 2014-07-25 MED ORDER — SODIUM CHLORIDE 0.9 % IV BOLUS (SEPSIS)
500.0000 mL | Freq: Once | INTRAVENOUS | Status: AC
  Administered 2014-07-25: 500 mL via INTRAVENOUS

## 2014-07-25 MED ORDER — PHENYLEPHRINE HCL 10 MG/ML IJ SOLN
0.0000 ug/min | INTRAVENOUS | Status: DC
  Administered 2014-07-25: 8 ug/min via INTRAVENOUS
  Filled 2014-07-25: qty 1

## 2014-07-25 MED ORDER — SODIUM CHLORIDE 0.9 % IV BOLUS (SEPSIS)
1000.0000 mL | INTRAVENOUS | Status: AC | PRN
  Administered 2014-07-25: 1000 mL via INTRAVENOUS

## 2014-07-25 MED ORDER — STERILE WATER FOR INJECTION IV SOLN
INTRAVENOUS | Status: DC
  Administered 2014-07-25: 16:00:00 via INTRAVENOUS
  Filled 2014-07-25 (×4): qty 850

## 2014-07-25 MED ORDER — VASOPRESSIN 20 UNIT/ML IV SOLN
0.0300 [IU]/min | INTRAVENOUS | Status: DC
  Administered 2014-07-25: 0.03 [IU]/min via INTRAVENOUS
  Filled 2014-07-25: qty 2

## 2014-07-25 MED ORDER — NOREPINEPHRINE 4 MG/250ML-% IV SOLN
INTRAVENOUS | Status: AC
  Administered 2014-07-25: 2 ug/min via INTRAVENOUS
  Filled 2014-07-25: qty 250

## 2014-07-25 MED ORDER — LORAZEPAM 2 MG/ML IJ SOLN
INTRAMUSCULAR | Status: AC
  Administered 2014-07-25: 03:00:00
  Filled 2014-07-25: qty 1

## 2014-07-25 MED ORDER — NOREPINEPHRINE 4 MG/250ML-% IV SOLN
0.0000 ug/min | INTRAVENOUS | Status: DC
  Administered 2014-07-25: 40 ug/min via INTRAVENOUS
  Administered 2014-07-25: 2 ug/min via INTRAVENOUS
  Administered 2014-07-25: 25 ug/min via INTRAVENOUS
  Filled 2014-07-25 (×2): qty 250

## 2014-07-25 MED ORDER — SODIUM CHLORIDE 0.9 % IV BOLUS (SEPSIS)
1000.0000 mL | Freq: Once | INTRAVENOUS | Status: AC
  Administered 2014-07-25: 12:00:00 1000 mL via INTRAVENOUS

## 2014-07-25 MED ORDER — IOHEXOL 240 MG/ML SOLN
25.0000 mL | INTRAMUSCULAR | Status: AC
Start: 2014-07-25 — End: 2014-07-25

## 2014-07-25 MED ORDER — SODIUM CHLORIDE 0.9 % IV SOLN
INTRAVENOUS | Status: DC
  Administered 2014-07-25: 12:00:00 via INTRAVENOUS

## 2014-07-25 MED ORDER — CHLORHEXIDINE GLUCONATE 0.12 % MT SOLN: 15.0000 mL | Freq: Two times a day (BID) | OROMUCOSAL | Status: DC

## 2014-07-25 MED ORDER — PIPERACILLIN-TAZOBACTAM 3.375 G IVPB
3.3750 g | Freq: Two times a day (BID) | INTRAVENOUS | Status: DC
  Administered 2014-07-25: 3.375 g via INTRAVENOUS
  Filled 2014-07-25 (×2): qty 50

## 2014-07-25 MED ORDER — NOREPINEPHRINE BITARTRATE 1 MG/ML IV SOLN: 0.0000 ug/min | INTRAVENOUS | Status: DC

## 2014-07-25 MED ORDER — MORPHINE SULFATE 2 MG/ML IJ SOLN
INTRAMUSCULAR | Status: AC
  Administered 2014-07-25: 2 mg via INTRAVENOUS
  Filled 2014-07-25: qty 1

## 2014-07-25 MED ORDER — NOREPINEPHRINE BITARTRATE 1 MG/ML IV SOLN
0.0000 ug/min | INTRAVENOUS | Status: DC
  Administered 2014-07-25: 30 ug/min via INTRAVENOUS
  Filled 2014-07-25: qty 16

## 2014-07-25 MED ORDER — HYDROCODONE-ACETAMINOPHEN 5-325 MG PO TABS
1.0000 | ORAL_TABLET | ORAL | Status: DC | PRN
  Administered 2014-07-25: 1 via ORAL
  Filled 2014-07-25: qty 1

## 2014-07-25 MED ORDER — MORPHINE SULFATE 2 MG/ML IJ SOLN
2.0000 mg | INTRAMUSCULAR | Status: DC | PRN
  Administered 2014-07-25 (×2): 2 mg via INTRAVENOUS
  Filled 2014-07-25: qty 1

## 2014-07-25 MED ORDER — LORAZEPAM 2 MG/ML IJ SOLN
2.0000 mg | Freq: Once | INTRAMUSCULAR | Status: AC
  Administered 2014-07-25: 03:00:00 2 mg via INTRAVENOUS

## 2014-07-25 MED ORDER — CETYLPYRIDINIUM CHLORIDE 0.05 % MT LIQD
7.0000 mL | Freq: Two times a day (BID) | OROMUCOSAL | Status: DC
  Administered 2014-07-25: 7 mL via OROMUCOSAL

## 2014-07-25 MED ORDER — SODIUM CHLORIDE 0.9 % IV SOLN: INTRAVENOUS | Status: DC

## 2014-07-26 NOTE — Outcomes Assessment (Signed)
HRdrpooed Pt is cynotic pt was unresponsive. Code called. Emergency meds given CPR started.Md at bed side.pt with no spontinous  resprations auidble heart sounds and no palpable pulse. Pt prounced death at 22.20. [facility] was called Spoke to [employer] was denied. [phone]#[id number].cleaned and send to morgue.

## 2014-07-27 MED FILL — Medication: Qty: 1 | Status: AC

## 2014-07-28 LAB — STOOL CULTURE

## 2014-08-04 NOTE — Discharge Summary (Signed)
[facility] Physicians -  at [facility]   PATIENT NAME: Pedro Carlson    MR#:  [id number]  DATE OF BIRTH:  Jun 03, 1940  DATE OF ADMISSION:  07/23/2014 ADMITTING PHYSICIAN: Milagros Loll, MD  DATE OF DISCHARGE: 07/26/2014  2:50 AM  PRIMARY CARE PHYSICIAN: Jaclyn Shaggy, MD    ADMISSION DIAGNOSIS:  COPD with acute exacerbation [J44.1] Acute renal failure, unspecified acute renal failure type [N17.9]  DISCHARGE DIAGNOSIS:  Active Problems:   ARF (acute renal failure)   Diarrhea   Dehydration   COPD exacerbation   Vomiting   Gastroenteritis   SECONDARY DIAGNOSIS:   Past Medical History  Diagnosis Date  . COPD (chronic obstructive pulmonary disease)   . Diabetes mellitus without complication   . Hypertension   . CAD (coronary artery disease)   . Spinal stenosis   . Tobacco abuse   . Hyperlipidemia      ADMITTING HISTORY  Pedro Carlson is a 74 y.o. Male with HTN, DM, CAD, COPD, Tobacco abuse who called EMS due to respiratory distress. He does have a history of COPD and uses inhalers. He had moderate reported wheezing which has improved somewhat after DuoNeb treatment by EMS prehospital. He has had 2 days of nausea vomiting and diarrhea. He's had no fever but had chills. No blood in vomiting or diarrhea. 3 episodes of watery diarrhea here in the emergency room.  breathing is improved after Solu-Medrol and DuoNeb.  Found to have acute renal failure with severe dehydration from his vomiting and diarrhea. No recent antibiotics use. Mild on and off abdominal pain, presently pain-free. C. difficile checked in the emergency room and negative.   HOSPITAL COURSE:   # Acute renal failure due to ATN #  Septic shock #  COPD exacerbation #  Gastroenteritis #  Acute encephalopathy #  Elevated troponin due to demand ischemia #  Hypertension #  Diabetes mellitus #  Tobacco abuse #  GI bleed #  Acute respiratory failure, hypoxic  # severe metabolic  acidosis  # bradycardia   please refer to detailed progress note previously. The patient was admitted to the hospital initially for possible gastroenteritis and COPD exacerbation. His abdominal examination was benign all through. Later on 22-Aug-2014 patient deteriorated quickly. He started having multiple bouts of diarrhea which were bloody. GI was consulted. The patient was supposed to have a CT scan of the abdomen but with further deterioration and being unstable could not get the imaging study. Was transferred to ICU. Start on broad-spectrum antibiotics. Surgery was consulted but the patient was not thought to be a surgical candidate by Dr. Michela Pitcher.  Patient needed pressor support. As per patient's wishes he was not intubated and later changed to do not resuscitate by family. With worsening condition and no response to aggressive therapy all.  The patient had to be pronounced dead at 22.20 PM on 08/22/2014.  CONSULTS OBTAINED:  Treatment Team:  Mosetta Pigeon, MD Tiney Rouge III, MD Dr. Marva Panda DRUG ALLERGIES:  No Known Allergies  DISCHARGE MEDICATIONS:   Discharge Medication List as of 07/26/2014  3:02 AM    CONTINUE these medications which have NOT CHANGED   Details  albuterol (PROVENTIL HFA;VENTOLIN HFA) 108 (90 BASE) MCG/ACT inhaler Inhale 1 puff into the lungs every 6 (six) hours as needed for shortness of breath., Until Discontinued, Historical Med    aspirin EC 81 MG tablet Take 81 mg by mouth daily., Until Discontinued, Historical Med    diazepam (VALIUM) 10 MG tablet Take  10 mg by mouth at bedtime., Until Discontinued, Historical Med    insulin glargine (LANTUS) 100 UNIT/ML injection Inject 7 Units into the skin at bedtime., Until Discontinued, Historical Med    lisinopril (PRINIVIL,ZESTRIL) 20 MG tablet Take 20 mg by mouth daily., Until Discontinued, Historical Med    loratadine (CLARITIN) 10 MG tablet Take 10 mg by mouth daily., Until Discontinued, Historical Med    metFORMIN  (GLUCOPHAGE) 1000 MG tablet Take 1,000 mg by mouth 2 (two) times daily., Until Discontinued, Historical Med    metoprolol (LOPRESSOR) 50 MG tablet Take 50 mg by mouth 2 (two) times daily., Until Discontinued, Historical Med    montelukast (SINGULAIR) 10 MG tablet Take 10 mg by mouth daily., Until Discontinued, Historical Med    omeprazole (PRILOSEC) 20 MG capsule Take 20 mg by mouth daily., Until Discontinued, Historical Med    prasugrel (EFFIENT) 10 MG TABS tablet Take 10 mg by mouth daily., Until Discontinued, Historical Med    simvastatin (ZOCOR) 40 MG tablet Take 40 mg by mouth daily., Until Discontinued, Historical Med    Umeclidinium-Vilanterol 62.5-25 MCG/INH AEPB Inhale 1 puff into the lungs daily., Until Discontinued, Historical Med             PHYSICAL EXAMINATION:  Physical Exam   DATA REVIEW:   CBC No results for input(s): WBC, HGB, HCT, PLT in the last 168 hours.  Chemistries  No results for input(s): NA, K, CL, CO2, GLUCOSE, BUN, CREATININE, CALCIUM, MG, AST, ALT, ALKPHOS, BILITOT in the last 168 hours.  Invalid input(s): GFRCGP  Cardiac Enzymes No results for input(s): TROPONINI in the last 168 hours.  Microbiology Results  Results for orders placed or performed during the hospital encounter of 08/06/2014  C difficile quick scan w PCR reflex [facility])     Status: None   Collection Time: 07/26/2014 11:36 AM  Result Value Ref Range Status   C Diff antigen NEGATIVE  Final   C Diff toxin NEGATIVE  Final   C Diff interpretation Negative for C. difficile  Final  Stool culture     Status: None   Collection Time: 2014/08/19 12:01 AM  Result Value Ref Range Status   Specimen Description STOOL  Final   Special Requests NONE  Final   Culture   Final    NO SALMONELLA OR SHIGELLA ISOLATED No Pathogenic E. coli detected NO CAMPYLOBACTER DETECTED    Report Status 07/28/2014 FINAL  Final  MRSA PCR Screening     Status: None   Collection Time: 08/19/14  6:07 PM   Result Value Ref Range Status   MRSA by PCR NEGATIVE NEGATIVE Final    Comment:        The GeneXpert MRSA Assay (FDA approved for NASAL specimens only), is one component of a comprehensive MRSA colonization surveillance program. It is not intended to diagnose MRSA infection nor to guide or monitor treatment for MRSA infections.     RADIOLOGY:  No results found.  CODE STATUS:  Advance Directive Documentation        Most Recent Value   Type of Advance Directive  Living will   Pre-existing out of facility DNR order (yellow form or pink MOST form)     "MOST" Form in Place?         Milagros Loll R M.D on 08/04/2014 at 2:07 AM  Between 7am to 6pm - Pager - [phone]  After 6pm go to [email or web address] - Scientist, research (life sciences) Hospitalists  Office  [phone]  CC: Primary care physician; Albina Billet, MD

## 2014-08-12 NOTE — Progress Notes (Signed)
VS taken by Pilar Plate, RN

## 2014-08-21 NOTE — Progress Notes (Signed)
ANTIBIOTIC CONSULT NOTE - INITIAL  Pharmacy Consult for Zosyn Indication: Intra-abdominal infection  No Known Allergies  Patient Measurements: Height: 6' (182.9 cm) Weight: 142 lb 3.2 oz (64.501 kg) IBW/kg (Calculated) : 77.6   Vital Signs: Temp: 97.7 F (36.5 C) (06/04 1328) Temp Source: Axillary (06/04 1328) BP: 155/127 mmHg (06/04 1645) Pulse Rate: 46 (06/04 1530) Intake/Output from previous day: 06/03 0701 - 06/04 0700 In: 1641.7 [P.O.:240; I.V.:1401.7] Out: 1 [Stool:1] Intake/Output from this shift: Total I/O In: 240 [P.O.:240] Out: 10 [Urine:2; Stool:8]  Labs:  Recent Labs  08/14/2014 1132 May 28, 2014 0507 May 28, 2014 1353  WBC 9.3 12.2*  --   HGB 14.3 12.8* 12.3*  PLT 287 258  --   CREATININE 2.73* 3.60*  --    Estimated Creatinine Clearance: 16.7 mL/min (by C-G formula based on Cr of 3.6). No results for input(s): VANCOTROUGH, VANCOPEAK, VANCORANDOM, GENTTROUGH, GENTPEAK, GENTRANDOM, TOBRATROUGH, TOBRAPEAK, TOBRARND, AMIKACINPEAK, AMIKACINTROU, AMIKACIN in the last 72 hours.   Microbiology: Recent Results (from the past 720 hour(s))  C difficile quick scan w PCR reflex [facility])     Status: None   Collection Time: 08/14/2014 11:36 AM  Result Value Ref Range Status   C Diff antigen NEGATIVE  Final   C Diff toxin NEGATIVE  Final   C Diff interpretation Negative for C. difficile  Final    Medical History: Past Medical History  Diagnosis Date  . COPD (chronic obstructive pulmonary disease)   . Diabetes mellitus without complication   . Hypertension   . CAD (coronary artery disease)   . Spinal stenosis   . Tobacco abuse   . Hyperlipidemia     Assessment: 74 yo male starting on Zosyn for possible intra-abdominal infection  Goal of Therapy:  Resolution of infection  Plan:  Based on CrCL <20 ml/min, will order Zosyn 3.375 g IV q12h EI  Marty HeckWang, Lorian Yaun L 07/27/2014,4:46 PM

## 2014-08-21 NOTE — Plan of Care (Signed)
Problem: Discharge Progression Outcomes Goal: Discharge plan in place and appropriate Individualization  Outcome: Progressing Address patient as Pedro Carlson. Patient is from home, lives with wife. Patient is a smoker. Smokes 1/2 pack of cigarettes daily. Patient is a high falls risk, offer toileting every hour. Patient has history of COPD, DM, HTN and CAD controlled with home medication.    Goal: Other Discharge Outcomes/Goals Outcome: Progressing Plan of care progress to goals: Discharge: return to home with wife Pain: c/o generalized back pain, improved with PRN pain medication Hemodynamic: WBC 9.3; IVF infusing per MD order; VSS Complications: c/o nausea, not relieved with zofran. Dr. Clint GuyHower notified new order for Phenergan 12.5mg  IV once obtained, pt reported some improvement. Pt also restless, new order for Ativan 2mg  IV once entered by Dr. Clint GuyHower with no improvement noted. Pt more restless and agitated.  Tolerating diet: patients nausea has improved as shift progressed, requesting coffee and no further c/o nausea since phenergan given.  Activity: Pt continues to be weak, [facility] assist.  Wife is at bedside, pt is pleasantly confused with increased agitation and restlessness. Patient has had 1loose BM this shift, stool  sample sent per MD order, awaiting results.

## 2014-08-21 NOTE — Progress Notes (Signed)
Verbal order from skulskie, order 3 way imaging of abdomen stat

## 2014-08-21 NOTE — Consult Note (Signed)
Pedro Carlson is a 74 y.o. male  admitted earlier this weekend with an exacerbation of his chronic respiratory insufficiency. He was noted to be severely dehydrated with elevated creatinine  HPI: He was admitted with acute on chronic respiratory embarrassment was noted to be severely dehydrated secondary to nausea and vomiting. His creatinine was elevated. He was gently rehydrated but developed some significant diarrhea. His diarrhea was guaiac positive. He was seen by the GI service and set up for Clostridium difficile and stool culture studies. He suffered a significant respiratory embarrassment this afternoon was transferred urgently the intensive care unit. There is developed significant abdominal distention hypotension and respiratory insufficiency. Plain films were largely unremarkable demonstrating an ileus. Blood gases demonstrated severe acidosis. He is on a rebreathing mask at the present time and supported by multiple pressors. The surgical service was asked to see him with regard to possible ischemic bowel injury versus an acute abdomen.   Past Medical History  Diagnosis Date  . COPD (chronic obstructive pulmonary disease)   . Diabetes mellitus without complication   . Hypertension   . CAD (coronary artery disease)   . Spinal stenosis   . Tobacco abuse   . Hyperlipidemia    Past Surgical History  Procedure Laterality Date  . Heart stent    . Cardiac catheterization     History   Social History  . Marital Status: Married    Spouse Name: N/A  . Number of Children: N/A  . Years of Education: N/A   Social History Main Topics  . Smoking status: Current Every Day Smoker -- 0.50 packs/day    Types: Cigarettes  . Smokeless tobacco: Not on file  . Alcohol Use: No  . Drug Use: No  . Sexual Activity: Not on file   Other Topics Concern  . None   Social History Narrative  . None    Review of Systems: ROSis impossible current situation with the patient's respiratory status  and altered mental status   PHYSICAL EXAM: BP 62/41 mmHg  Pulse 46  Temp(Src) 97.7 F (36.5 C) (Axillary)  Resp 33  Ht 6' (1.829 m)  Wt 64.501 kg (142 lb 3.2 oz)  BMI 19.28 kg/m2  SpO2 70%  Physical Exam  general: He is not responsive breathing 30-40 times a minute.  HEENT: He has no obvious facial deformities he does not follow commands with his eyes and does not have any evidence of scleral icterus.  Lymph: He has no cervical or axillary lymphadenopathy.  Lungs: He is very distant breath sounds with minimal pulmonary excursion and tubular breath sounds bilaterally. There is some distant wheezing.  Cardiac: He does not appear to have any gallop rhythms or murmurs but his rate is so fast it is very difficult to tell. I cannot appreciate any irregular rhythms.  Abdomen: Is markedly distended tympanitic abdomen is very quiet with only a very few bowel sounds. He does not have any abdominal tenderness but does not respond to abdominal examination.  Lower extremity exam reveals no significant deformities but he has no distal pulses and significant mottling.  Neurologic exam: Not possible and the current situation.  Psych exam: Not possible and the current situation.  Skin: His multiple areas of mottling without obvious contusions or bruises.   Impression I have independently reviewed his lab work and his abdominal and chest x-rays. He is not stable enough to undergo an abdominal CT scan. I suspect that he developed significant volume contraction due to his dehydration and  developed significant bowel ischemia either thrombotic or merely a low flow state. The diarrhea probably suggested significant mucosal ischemia. He does not have any evidence of bowel perforation on plain films but I suspect he probably does have severely ischemic bowel or possibly even necrotic bowel at this point. The opportunity for surgical intervention reversal has passed as this is gentleman is critically ill and  certainly not a surgical candidate. His bowel ischemia is most likely secondary to his underlying dehydration and renal insufficiency and respiratory embarrassment.    Tiney Rouge III, MD  07/28/2014, 6:41 PM

## 2014-08-21 NOTE — Progress Notes (Signed)
I was called to see this patient who was just transferred to the CCU. Patient is currently obtunded. His wife and family members are present. The family wants to revert his DNR. I have explained to them that this is not possible as he was lucid yesterday and had the conversation with Dr.Sudini. He also has a living will that states he would not any life saving measures.    Patient was brought to CCU as he had sudden decline in status. He is a DNI which was confirmed yesterday by Dr. Elpidio AnisSudini. His wife says he would have CPR and chest compressions/defbirillation if needed.  He had several black tarry stools this am and episodes of vomiting. He is still having some streaky bloody stools. His CXR and ABD XRAY does not show fluid overload or free air. IVF were stopped earlier due to the concern of fluid overload. His ABG shows a significant resp alkalosis. His hgb is stable at 12. His BP is low 85/54 and HR is 117.O2 95% on 100% NRB He is on a NRB and as mentioned obtunded. Pale appearing  CVS: tachy no m/g/r LUNGS: decreased breath sounds with no crackles ABD: soft, NT/ND hyperactive BS EXT no edema   LABS HGB 12 Ph 7.1 PCO2 49  A/P 74 y/o male who initially presented with diarrhea and COPD exacerbation who unfortunately has clinically declined in mental and respiratory status. He also has bloody bowel movements.  1. Acute respiratory failure: Patient initially presented with COPD exacerbation and now does not have wheezing on examination. He has acute respiratory alkalosis but is DO NOT INTUBATE. I will speak with respiratory therapy to see if CPAP may help, but the concern is his inability to protect his airway.  2. GI bleed: It is unclear at this point if this is from his upper GI or lower GI. More likely it is upper GI as he apparently he has had dark melanotic stools. GI is following. The plan is for abdominal CAT scan once the patient is more stable.  3. Hypovolemic shock: Patient's  blood pressure is low. I suspect his hypovolemia could be GI related. I will provide IV fluids. If the IV fluids do not help we will start pressors. Goal MEP is greater than 65.  4. Acute renal failure: I suspect this is ATN related to his GI bleed and persistent diarrhea and vomiting. Nephrology is consulted. At this point I do not feel the patient is fluid overloaded, he may be dehydrated. I will start IV fluids after a bolus of fluids. We will monitor the creatinine carefully.  5. Coronary artery disease: Patient had a slightly elevated troponin this is likely secondary to demand ischemia in the setting of the above issues.  Patient is critically ill. I have spoken with the family in great detail as well as the GI physician.    Critical care time spent 65 minutes

## 2014-08-21 NOTE — Progress Notes (Signed)
Dr Elpidio AnisSudini notified that patient is having frequent dark stools, hgb has dropped from previous day and breathing is labored. MD would like patient to get IV fluid bolus and okay to send stool for occult blood. Telephone order.

## 2014-08-21 NOTE — Progress Notes (Addendum)
Pt admitted for ARF, BUN and creatinine worsening, US renal performed, nephrology consult entered. Frequent loose dark maroon stools throughout shift, Dr. Elpidio AnisSudini notified and placed order for abdomen CT, hgb draw at 14:00, and gastro consult.  Pt having labored breathing throughout shift, Dr. Elpidio AnisSudini notified and chest xray ordered, IV fluids stopped. After Chest xray IV fluids were reinstated.     Dr. Thedore MinsSingh consulted with patient and ordered for pt to transfer to ccu due to difficulties of breathing. Prior to transfer Dr. Marva PandaSkulskie consulted with patient and performed rectal exam, during consutlation pt became cold and grey in color, o2 saturation was 75% on room air, pt placed on hiflo and transferred to CCU, [facility]   CT abdomen not performed prior to transfer bc patietn refused to drink contrast, Dr. Marva PandaSkulskie notified and ordered abdominal xray. Wife at bedside at time of transfer.

## 2014-08-21 NOTE — Progress Notes (Signed)
Chaplain responds to code, family in crisis as patient's conditions worsens. Chaplain provided spiritual presence, offered prayer and encouragement. Kaleen Maskhaplain Raymond D. Clovis RileyMitchell Saturday 08/19/2014   Feb 23, 2014 1332  Clinical Encounter Type  Visited With Patient and family together  Visit Type Initial;Spiritual support;Code  Referral From Nurse;Physician  Consult/Referral To Chaplain  Spiritual Encounters  Spiritual Needs Prayer;Emotional  Stress Factors  Patient Stress Factors Health changes;Major life changes  Family Stress Factors Family relationships;Health changes

## 2014-08-21 NOTE — Progress Notes (Signed)
[facility] - [place] at [facility]   PATIENT NAME: Pedro IgoWilliam Carlson    MR#:  [id number]  DATE OF BIRTH:  February 08, 1941  SUBJECTIVE:  CHIEF COMPLAINT:   Chief Complaint  Patient presents with  . Shortness of Breath  Vomiting/Diarrhea   Has been restless and confused overnight. Still has diarrhea. No vomiting. No abdominal pain. Low urine Output. Afebrile. REVIEW OF SYSTEMS:    ROS Confused  DRUG ALLERGIES:  No Known Allergies  VITALS:  Blood pressure 112/64, pulse 96, temperature 98 F (36.7 C), temperature source Oral, resp. rate 18, height 6' (1.829 m), weight 64.501 kg (142 lb 3.2 oz), SpO2 98 %.  PHYSICAL EXAMINATION:   Physical Exam  GENERAL:  74 y.o.-year-old patient lying in the bed with resp distress. Confused. EYES: Pupils equal, round, reactive to light and accommodation. No scleral icterus. Extraocular muscles intact.  HEENT: Head atraumatic, normocephalic. Oropharynx and nasopharynx clear. Dry oral mucosa. NECK:  Supple, no jugular venous distention. No thyroid enlargement, no tenderness.  LUNGS:Increased work of breathing. BB&T CorporationWeezing. Crackles right lower lobe. CARDIOVASCULAR: S1, S2 normal. No murmurs, rubs, or gallops.  ABDOMEN: Soft, nontender, nondistended. Bowel sounds present. No organomegaly or mass. Mild per-umbilical tenderness. EXTREMITIES: No cyanosis, clubbing or edema b/l.    NEUROLOGIC: Cranial nerves II through XII are intact. No focal Motor or sensory deficits b/l.   PSYCHIATRIC: The patient is alert and oriented x 3.  SKIN: No obvious rash, lesion, or ulcer.    LABORATORY PANEL:   CBC  Recent Labs Lab 10-29-2014 0507  WBC 12.2*  HGB 12.8*  HCT 39.9*  PLT 258   ------------------------------------------------------------------------------------------------------------------  Chemistries   Recent Labs Lab 10-29-2014 0507  NA 137  K 4.2  CL 92*  CO2 26  GLUCOSE 276*  BUN 88*  CREATININE 3.60*  CALCIUM 8.8*    ------------------------------------------------------------------------------------------------------------------  Cardiac Enzymes  Recent Labs Lab 08/16/2014 1132  TROPONINI 0.08*   ------------------------------------------------------------------------------------------------------------------  RADIOLOGY:  Dg Chest 2 View  08/11/2014   CLINICAL DATA:  Shortness of breath.  EXAM: CHEST  2 VIEW  COMPARISON:  None.  FINDINGS: The heart size and mediastinal contours are within normal limits. Both lungs are clear. No pneumothorax or pleural effusion is noted. 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  IMPRESSION: No active cardiopulmonary disease.   Electronically Signed   By: Lupita RaiderJames  Green Jr, M.D.   On: 08/17/2014 13:02     ASSESSMENT AND PLAN:   * Acute renal failure, pre renal from dehydration Due to vomiting, diarrhea and decreased intake. BUN/Cr worse inspite of IV fluid resuscitation. Check renal US. Consult nephrology. Obstruction? Held IVF till CXR to r/o pulmonary edema Check lactic acid. AG -19  * Gastroenteritis C. difficile is negative. We will send for stool cultures. No acute abdomen signs. Will need further investigation if symptoms don't resolve over the next 24-48 hours.  * COPD exacerbation Steroids, nebs.  * Hypertension continue home medications  * Diabetes mellitus Lantus, SSI, ADA.  * Coronary artery disease 0.08 troponin. Repeat troponin. No Chest pain or EKG changes. Likely due to demand ischemia in setting of COPD exacerbation and ARF.  * DVT prophylaxis Lovenox, renally dosed  * Tobacco abuse Counseled at admission  Critically ill with high risk for cardiac arrest and death  All the records are reviewed and case discussed with Care Management/Social Workerr. Management plans discussed with the patient, family and they are in agreement.  CODE STATUS: DNI/ OK TO RESUSCITATE  DVT Prophylaxis: Lovenox  TOTAL  TIME TAKING CARE OF THIS PATIENT: 40 minutes.   POSSIBLE D/C IN 2-3 DAYS, DEPENDING ON CLINICAL CONDITION.   Milagros Loll R M.D on 08-05-14 at 9:27 AM  Between 7am to 6pm - Pager - [phone]  After 6pm go to [email or web address] - password [email or web address] [facility]  [place] Hospitalists  Office  [phone]  CC: Primary care physician; Jaclyn Shaggy, MD

## 2014-08-21 NOTE — Progress Notes (Signed)
Dr. Clint GuyHower notified of patients restlessness and continued unresolved nausea; Also, wife states that she forgot to list on patients home medication list that he takes Hydrocodone for arthritis pain. MD to enter order for Hydrocodone and ativan one time dose. See new orders.

## 2014-08-21 NOTE — Consult Note (Signed)
Date: 07/23/2014                  Patient Name:  Pedro Carlson  MRN: [id number]  DOB: [birth year]  Age / Sex: 74 y.o., male         PCP: Jaclyn ShaggyATE,DENNY C, MD                 Service Requesting Consult: Internal medicine                 Reason for Consult: ARF            History of Present Illness: Patient is a 74 y.o. male with medical problems of HTN, DM-2, CAD, COPD, Tobacco abuse, who was admitted to [facility] on 08/14/2014 for evaluation of resp distress.  At present, patient is somewhat confused. He is tachypneic. No able to provide detailed information. All information is obtained for the chart and patient's wife.  Baseline Cr not available. Last known cr is 1.16  Admit Cr 2.73 Today 3.60  Renal u/s neg for obstruction bit shows small kidneys and abnormal appearance of liver  Patient is tachypneic, cold clammy extremities He is having loose stools which are reported to be black  Medications: Outpatient medications: Prescriptions prior to admission  Medication Sig Dispense Refill Last Dose  . albuterol (PROVENTIL HFA;VENTOLIN HFA) 108 (90 BASE) MCG/ACT inhaler Inhale 1 puff into the lungs every 6 (six) hours as needed for shortness of breath.   PRN at PRN  . aspirin EC 81 MG tablet Take 81 mg by mouth daily.   07/23/2014 at Unknown time  . diazepam (VALIUM) 10 MG tablet Take 10 mg by mouth at bedtime.   07/23/2014 at Unknown time  . insulin glargine (LANTUS) 100 UNIT/ML injection Inject 7 Units into the skin at bedtime.   07/23/2014 at Unknown time  . lisinopril (PRINIVIL,ZESTRIL) 20 MG tablet Take 20 mg by mouth daily.   07/23/2014 at Unknown time  . loratadine (CLARITIN) 10 MG tablet Take 10 mg by mouth daily.   07/23/2014 at Unknown time  . metFORMIN (GLUCOPHAGE) 1000 MG tablet Take 1,000 mg by mouth 2 (two) times daily.   07/23/2014 at Unknown time  . metoprolol (LOPRESSOR) 50 MG tablet Take 50 mg by mouth 2 (two) times daily.   07/23/2014 at 2100  . montelukast (SINGULAIR) 10 MG tablet  Take 10 mg by mouth daily.   07/23/2014 at Unknown time  . omeprazole (PRILOSEC) 20 MG capsule Take 20 mg by mouth daily.   07/23/2014 at Unknown time  . prasugrel (EFFIENT) 10 MG TABS tablet Take 10 mg by mouth daily.   07/23/2014 at Unknown time  . simvastatin (ZOCOR) 40 MG tablet Take 40 mg by mouth daily.   07/23/2014 at Unknown time  . Umeclidinium-Vilanterol 62.5-25 MCG/INH AEPB Inhale 1 puff into the lungs daily.   07/23/2014 at Unknown time    Current medications: Current Facility-Administered Medications  Medication Dose Route Frequency Provider Last Rate Last Dose  . 0.9 %  sodium chloride infusion   Intravenous Continuous Adrian SaranSital Mody, MD      . acetaminophen (TYLENOL) tablet 650 mg  650 mg Oral Q6H PRN Milagros LollSrikar Sudini, MD       Or  . acetaminophen (TYLENOL) suppository 650 mg  650 mg Rectal Q6H PRN Milagros LollSrikar Sudini, MD      . aspirin EC tablet 81 mg  81 mg Oral Daily Milagros LollSrikar Sudini, MD   81 mg at 08/15/2014 0959  .  diazepam (VALIUM) tablet 10 mg  10 mg Oral QHS Milagros Loll, MD   10 mg at 2014-07-28 2242  . HYDROcodone-acetaminophen (NORCO/VICODIN) 5-325 MG per tablet 1 tablet  1 tablet Oral Q4H PRN Wyatt Haste, MD   1 tablet at 07/28/2014 0347  . insulin aspart (novoLOG) injection 0-15 Units  0-15 Units Subcutaneous TID WC Milagros Loll, MD   5 Units at 07/28/2014 1220  . insulin aspart (novoLOG) injection 0-5 Units  0-5 Units Subcutaneous QHS Milagros Loll, MD   2 Units at 07-28-14 2241  . insulin glargine (LANTUS) injection 7 Units  7 Units Subcutaneous QHS Milagros Loll, MD   7 Units at Jul 28, 2014 2241  . ipratropium-albuterol (DUONEB) 0.5-2.5 (3) MG/3ML nebulizer solution 3 mL  3 mL Nebulization Q6H Srikar Sudini, MD   3 mL at 07/23/2014 0825  . loperamide (IMODIUM) capsule 2 mg  2 mg Oral Q6H PRN Milagros Loll, MD   2 mg at 08/03/2014 0959  . loratadine (CLARITIN) tablet 10 mg  10 mg Oral Daily Milagros Loll, MD   10 mg at 08/18/2014 1219  . methylPREDNISolone sodium succinate (SOLU-MEDROL) 125 mg/2 mL  injection 60 mg  60 mg Intravenous Q24H Milagros Loll, MD   60 mg at 07-28-14 2345  . metoprolol (LOPRESSOR) tablet 50 mg  50 mg Oral BID Milagros Loll, MD   50 mg at 08/17/2014 0959  . montelukast (SINGULAIR) tablet 10 mg  10 mg Oral Daily Milagros Loll, MD   10 mg at 08/19/2014 0959  . morphine 2 MG/ML injection 2 mg  2 mg Intravenous Q2H PRN Milagros Loll, MD   2 mg at 07/27/2014 1353  . ondansetron (ZOFRAN) tablet 4 mg  4 mg Oral Q4H PRN Wyatt Haste, MD       Or  . ondansetron [facility]) injection 4 mg  4 mg Intravenous Q6H PRN Wyatt Haste, MD      . pantoprazole (PROTONIX) EC tablet 40 mg  40 mg Oral Daily Milagros Loll, MD   40 mg at 08/04/2014 0959  . simvastatin (ZOCOR) tablet 40 mg  40 mg Oral Daily Milagros Loll, MD   40 mg at 08/15/2014 0959  . sodium chloride 0.9 % bolus 1,000 mL  1,000 mL Intravenous PRN Adrian Saran, MD   1,000 mL at 07/23/2014 1417  . Umeclidinium-Vilanterol 62.5-25 MCG/INH AEPB 1 puff  1 puff Inhalation Daily Milagros Loll, MD          Allergies: No Known Allergies    Past Medical History: Past Medical History  Diagnosis Date  . COPD (chronic obstructive pulmonary disease)   . Diabetes mellitus without complication   . Hypertension   . CAD (coronary artery disease)   . Spinal stenosis   . Tobacco abuse   . Hyperlipidemia      Past Surgical History: Past Surgical History  Procedure Laterality Date  . Heart stent    . Cardiac catheterization       Family History: Family History  Problem Relation Age of Onset  . CVA Father      Social History: History   Social History  . Marital Status: Married    Spouse Name: N/A  . Number of Children: N/A  . Years of Education: N/A   Occupational History  . Not on file.   Social History Main Topics  . Smoking status: Current Every Day Smoker -- 0.50 packs/day    Types: Cigarettes  . Smokeless tobacco: Not on file  . Alcohol Use:  No  . Drug Use: No  . Sexual Activity: Not on file   Other Topics  Concern  . Not on file   Social History Narrative  . No narrative on file     Review of Systems: unreliable.   Vital Signs: Blood pressure 98/52, pulse 83, temperature 97.7 F (36.5 C), temperature source Axillary, resp. rate 41, height 6' (1.829 m), weight 64.501 kg (142 lb 3.2 oz), SpO2 63 %.  Weight trends: Filed Weights   08/12/2014 1109 08/02/2014 1605 2014-08-07 0300  Weight: 68.947 kg (152 lb) 67.405 kg (148 lb 9.6 oz) 64.501 kg (142 lb 3.2 oz)    Physical Exam: General:  ill appearing  HEENT Anicteric, moist mucus membranes  Neck:  supple  Lungs: Crackles b/l, tachypneic  Heart:  tachycardic,   Abdomen: Soft, non tender  Extremities:  trace edema  Neurologic: Confused, follows some commands  Skin: No acute rashes             Lab results: Basic Metabolic Panel:  Recent Labs Lab 08/16/2014 1132 2014-08-07 0507  NA 140 137  K 4.6 4.2  CL 95* 92*  CO2 26 26  GLUCOSE 250* 276*  BUN 52* 88*  CREATININE 2.73* 3.60*  CALCIUM 10.1 8.8*    Liver Function Tests: No results for input(s): AST, ALT, ALKPHOS, BILITOT, PROT, ALBUMIN in the last 168 hours. No results for input(s): LIPASE, AMYLASE in the last 168 hours. No results for input(s): AMMONIA in the last 168 hours.  CBC:  Recent Labs Lab 08/18/2014 1132 2014-08-07 0507 August 07, 2014 1353  WBC 9.3 12.2*  --   NEUTROABS 6.8*  --   --   HGB 14.3 12.8* 12.3*  HCT 44.9 39.9*  --   MCV 91.9 91.6  --   PLT 287 258  --     Cardiac Enzymes:  Recent Labs Lab 2014-08-07 0933  TROPONINI 0.14*    BNP: Invalid input(s): POCBNP  CBG:  Recent Labs Lab 08/09/2014 2159 2014/08/07 0743 2014-08-07 1104 08/07/14 1310 08-07-14 1327  GLUCAP 207* 261* 220* 203* 194*    Microbiology: Results for orders placed or performed during the hospital encounter of 07/27/2014  C difficile quick scan w PCR reflex [facility])     Status: None   Collection Time: 08/01/2014 11:36 AM  Result Value Ref Range Status   C Diff antigen NEGATIVE   Final   C Diff toxin NEGATIVE  Final   C Diff interpretation Negative for C. difficile  Final    Coagulation Studies: No results for input(s): LABPROT, INR in the last 72 hours.  Urinalysis: No results for input(s): COLORURINE, LABSPEC, PHURINE, GLUCOSEU, HGBUR, BILIRUBINUR, KETONESUR, PROTEINUR, UROBILINOGEN, NITRITE, LEUKOCYTESUR in the last 72 hours.  Invalid input(s): APPERANCEUR    Imaging: Dg Chest 2 View  07/28/2014   CLINICAL DATA:  Shortness of breath.  EXAM: CHEST  2 VIEW  COMPARISON:  None.  FINDINGS: The heart size and mediastinal contours are within normal limits. Both lungs are clear. No pneumothorax or pleural effusion is noted. 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  IMPRESSION: No active cardiopulmonary disease.   Electronically Signed   By: Lupita Raider, M.D.   On: 08/11/2014 13:02   US Renal  08/07/14   CLINICAL DATA:  Acute renal failure.  EXAM: RENAL / URINARY TRACT ULTRASOUND COMPLETE  COMPARISON:  None.  FINDINGS: Right Kidney:  Length: 7.7 cm. Echogenicity within normal limits. No mass or hydronephrosis visualized.  Left Kidney:  Length: 8.5 cm. Echogenicity within normal limits. No mass or hydronephrosis visualized.  Bladder:  Appears normal for degree of bladder distention.  Visualized portions of the liver show are heterogeneous in echotexture with areas of increased echogenicity. The etiology is unclear. Consider followup liver imaging. Cysts is noted in the visualized spleen along its inferior margin.  IMPRESSION: 1. Relatively small kidneys. Normal parenchymal echogenicity. No renal masses. No hydronephrosis. 2. Abnormal appearance of the liver. Consider follow-up CT scan of the abdomen with contrast for further evaluation versus liver MRI with and without contrast.   Electronically Signed   By: Amie Portland M.D.   On: 07/27/2014 11:52   Dg Chest Port 1 View  07/23/2014   CLINICAL DATA:  Shortness of breath.  History of COPD.  EXAM:  PORTABLE CHEST - 1 VIEW  COMPARISON:  08/03/2014  FINDINGS: Cardiac silhouette normal in size and configuration. No mediastinal or hilar masses or evidence of adenopathy.  Lungs hyperexpanded but clear. No pleural effusion or pneumothorax. Bony thorax is grossly intact.  IMPRESSION: 1. No acute cardiopulmonary disease. 2. COPD.   Electronically Signed   By: Amie Portland M.D.   On: 08/11/2014 11:46      Assessment & Plan: Patient is a 74 y.o. male with medical problems of HTN, DM-2, CAD, COPD, Tobacco abuse, who was admitted to [facility] on 08-03-14 for evaluation of resp distress.  1. ARF - likely ATN - avoid hypotension - Electrolytes and Volume status are acceptable No acute indication for Dialysis at present but may need soon  2. Pulmonary edema - avoid iv fluids - may need pressors  3. Lactic Acidosis - underlying cause likely ischemic  - GI eval in progress

## 2014-08-21 NOTE — Progress Notes (Signed)
PT arrived to CCU from [facility].

## 2014-08-21 NOTE — Op Note (Signed)
[facility]   PROCEDURE NOTE  PROCEDURE: 1. Right femoral central venous catheter placement 2. Right femoral vein cannulation under ultrasound guidance  PRE-OPERATIVE DIAGNOSIS: Acute respiratory failure with likely respiratory collapse, acute renal failure, COPD exacerbation  POST-OPERATIVE DIAGNOSIS: same as above  SURGEON: Festus BarrenJason Saylor Murry, MD  ANESTHESIA:  None  ESTIMATED BLOOD LOSS: minimal  FINDING(S): Right jugular vein very flat and difficult to cannulate with respiratory issues. Right femoral vein widely patent and line easily placed.  SPECIMEN(S):  none  INDICATIONS:   Pedro Carlson is a 74 y.o. male who presents with need for venous access.  The patient presents for central venous catheter placement.  The patients family is aware the risks of central venous catheter placement include but are not limited to: bleeding, infection, central venous injury, pneumothorax, possible venous stenosis, possible malpositioning in the venous system, and possible infections related to long-term catheter presence. The patient was aware of these risks and agreed to proceed.  DESCRIPTION: After written informed consent was obtained from the patient and/or family, the patient was placed supine in the hospital bed.  The patient was prepped with chloraprep and draped in the standard fashion for a chest or neck central venous catheter placement.  I anesthesized the neck cannulation site with 1% lidocaine but the vein was extremely flat and even in Trendelenburg the vein was not easily accessed with the patient's severe respiratory issues, BiPAP, and tachypnea. It was clear this was going to be a difficult and potentially dangerous situation, so I turned my attention to the right femoral vein. The right groin was sterilely prepped and draped. I then anesthetized the area with 1% lidocaine and then under ultrasound guidance, the right femoral  vein was cannulated with the 18 gauge  needle.  A J wire was then placed.  After a skin nick and dilatation, the triple lumen central venous catheter was placed over the wire and the wire was removed.  Each port was aspirated and flushed with sterile normal saline.  The catheter was secured in placed with three interrupted stitches of 3-0 Silk tied to the catheter.  The catheter was dressed with sterile dressing.  Stat CXR is pending  COMPLICATIONS: none apparent  CONDITION: stable  Pedro Carlson 08/10/2014, 3:31 PM

## 2014-08-21 NOTE — Consult Note (Signed)
[facility] Vascular Consult Note  MRN : [id number]  Pedro Carlson is a 74 y.o. (11-25-1940) male who presents with chief complaint of  Chief Complaint  Patient presents with  . Shortness of Breath  .   History of Present Illness: Patient transferred to the critical care unit for worsening hypoxia and shortness of breath. He was admitted yesterday with COPD exacerbation. He is a DO NOT INTUBATE, and currently has sats in the 60s and 70s on BiPAP. He has very tenuous venous access and the family is now requesting aggressive care so we are asked to place a central line. He is obtunded and unable to provide any history, and this is obtained from the previous medical records. He apparently is quite dehydrated and has been having diarrhea continuously for a couple of weeks.  Current Facility-Administered Medications  Medication Dose Route Frequency Provider Last Rate Last Dose  . 0.9 %  sodium chloride infusion   Intravenous Continuous Adrian SaranSital Mody, MD      . acetaminophen (TYLENOL) tablet 650 mg  650 mg Oral Q6H PRN Milagros LollSrikar Sudini, MD       Or  . acetaminophen (TYLENOL) suppository 650 mg  650 mg Rectal Q6H PRN Srikar Sudini, MD      . antiseptic oral rinse (CPC / CETYLPYRIDINIUM CHLORIDE 0.05%) solution 7 mL  7 mL Mouth Rinse q12n4p Sital Mody, MD      . aspirin EC tablet 81 mg  81 mg Oral Daily Milagros LollSrikar Sudini, MD   81 mg at October 12, 2014 0959  . chlorhexidine (PERIDEX) 0.12 % solution 15 mL  15 mL Mouth Rinse BID Sital Mody, MD      . diazepam (VALIUM) tablet 10 mg  10 mg Oral QHS Milagros LollSrikar Sudini, MD   10 mg at 08/05/2014 2242  . HYDROcodone-acetaminophen (NORCO/VICODIN) 5-325 MG per tablet 1 tablet  1 tablet Oral Q4H PRN Wyatt Hasteavid K Hower, MD   1 tablet at October 12, 2014 0347  . insulin aspart (novoLOG) injection 0-15 Units  0-15 Units Subcutaneous TID WC Milagros LollSrikar Sudini, MD   5 Units at October 12, 2014 1220  . insulin aspart (novoLOG) injection 0-5 Units  0-5 Units Subcutaneous QHS Milagros LollSrikar  Sudini, MD   2 Units at 08/01/2014 2241  . insulin glargine (LANTUS) injection 7 Units  7 Units Subcutaneous QHS Milagros LollSrikar Sudini, MD   7 Units at 08/13/2014 2241  . ipratropium-albuterol (DUONEB) 0.5-2.5 (3) MG/3ML nebulizer solution 3 mL  3 mL Nebulization Q6H Srikar Sudini, MD   3 mL at October 12, 2014 0825  . loperamide (IMODIUM) capsule 2 mg  2 mg Oral Q6H PRN Milagros LollSrikar Sudini, MD   2 mg at October 12, 2014 0959  . loratadine (CLARITIN) tablet 10 mg  10 mg Oral Daily Milagros LollSrikar Sudini, MD   10 mg at October 12, 2014 1219  . methylPREDNISolone sodium succinate (SOLU-MEDROL) 125 mg/2 mL injection 60 mg  60 mg Intravenous Q24H Milagros LollSrikar Sudini, MD   60 mg at 07/23/2014 2345  . metoprolol (LOPRESSOR) tablet 50 mg  50 mg Oral BID Milagros LollSrikar Sudini, MD   50 mg at October 12, 2014 0959  . montelukast (SINGULAIR) tablet 10 mg  10 mg Oral Daily Milagros LollSrikar Sudini, MD   10 mg at October 12, 2014 0959  . morphine 2 MG/ML injection 2 mg  2 mg Intravenous Q2H PRN Milagros LollSrikar Sudini, MD   2 mg at October 12, 2014 1353  . ondansetron (ZOFRAN) tablet 4 mg  4 mg Oral Q4H PRN Wyatt Hasteavid K Hower, MD       Or  .  ondansetron (ZOFRAN) injection 4 mg  4 mg Intravenous Q6H PRN Wyatt Haste, MD      . pantoprazole (PROTONIX) EC tablet 40 mg  40 mg Oral Daily Milagros Loll, MD   40 mg at 08/12/2014 0959  . simvastatin (ZOCOR) tablet 40 mg  40 mg Oral Daily Milagros Loll, MD   40 mg at 08/04/2014 0959  . sodium chloride 0.9 % bolus 1,000 mL  1,000 mL Intravenous PRN Adrian Saran, MD   1,000 mL at 07/22/2014 1417  . Umeclidinium-Vilanterol 62.5-25 MCG/INH AEPB 1 puff  1 puff Inhalation Daily Milagros Loll, MD        Past Medical History  Diagnosis Date  . COPD (chronic obstructive pulmonary disease)   . Diabetes mellitus without complication   . Hypertension   . CAD (coronary artery disease)   . Spinal stenosis   . Tobacco abuse   . Hyperlipidemia     Past Surgical History  Procedure Laterality Date  . Heart stent    . Cardiac catheterization      Social History History  Substance Use Topics   . Smoking status: Current Every Day Smoker -- 0.50 packs/day    Types: Cigarettes  . Smokeless tobacco: Not on file  . Alcohol Use: No   apparently lived at home with wife  Family History Family History  Problem Relation Age of Onset  . CVA Father    otherwise unknown, and patient unable to provide  No Known Allergies   REVIEW OF SYSTEMS (Negative unless checked)  Patient unable to provide review of systems due to mental status with severity of his illness.  Physical Examination  Filed Vitals:   08/05/2014 1328 07/31/2014 1400 07/22/2014 1430 08/01/2014 1500  BP: 222/180 85/54 98/52 112/82  Pulse:  115 83 114  Temp: 97.7 F (36.5 C)     TempSrc: Axillary     Resp: 45 28 41 30  Height:      Weight:      SpO2:  78% 63%    Body mass index is 19.28 kg/(m^2). The patient is critically ill appearing and quite tachypnic on BiPAP Head: [place]/AT,  Ear/Nose/Throat: External appearance intact, small scar present overlying midline neck. Eyes: PERRLA, EOMI.  Neck: Supple, no nuchal rigidity.  No bruit or JVD.  Pulmonary: Wheezing present. Patient quite tachypnea with labored breathing on BiPAP Cardiac: Tachycardic without murmur Vascular: Pedal pulses not easily palpated. Capillary refill somewhat sluggish.                                          Gastrointestinal: soft, non-tender/non-distended. No guarding/reflex. Musculoskeletal:   No deformity or atrophy. No edema. Neurologic: Difficult to assess due to mental status. Moves arms and legs spontaneously Psychiatric: Unable to assess due to severity of illness and him being essentially obtunded Dermatologic: No rashes or ulcers noted.  No cellulitis or open wounds. Lymph : No Cervical, Axillary, or Inguinal lymphadenopathy.      CBC Lab Results  Component Value Date   WBC 12.2* 08/07/2014   HGB 12.3* 07/31/2014   HCT 39.9* 07/29/2014   MCV 91.6 08/20/2014   PLT 258 07/23/2014    BMET    Component Value Date/Time    NA 137 08/06/2014 0507   NA 142 05/19/2011 0020   K 4.2 07/23/2014 0507   K 4.3 05/19/2011 0020   CL 92* 08/09/2014 0507  CL 108* 05/19/2011 0020   CO2 26 Jul 28, 2014 0507   CO2 24 05/19/2011 0020   GLUCOSE 276* 28-Jul-2014 0507   GLUCOSE 116* 05/19/2011 0020   BUN 88* 07-28-14 0507   BUN 17 05/19/2011 0020   CREATININE 3.60* 2014/07/28 0507   CREATININE 1.16 05/19/2011 0020   CALCIUM 8.8* 28-Jul-2014 0507   CALCIUM 8.3* 05/19/2011 0020   GFRNONAA 15* 07-28-14 0507   GFRAA 18* 2014-07-28 0507   Estimated Creatinine Clearance: 16.7 mL/min (by C-G formula based on Cr of 3.6).  COAG No results found for: INR, PROTIME   Assessment/Plan 1. Acute respiratory failure with likely impending respiratory collapse: The patient was adamant that he would not want intubation. The family wants aggressive care otherwise. As such, we are placing a central line for venous access. Prognosis extremely poor and he is unlikely to survive the day. Medical service managing as aggressively as possible without intubation. 2. Acute renal failure: May ultimately require dialysis, but unlikely to survive. If dialysis catheter needed please reconsult our service 3. Ongoing tobacco dependence: Causing underlying COPD and respiratory issues.   DEW,JASON, MD  2014/07/28 3:24 PM    Level III consult

## 2014-08-21 NOTE — Progress Notes (Signed)
Called by nursing staff for bradycardia Hr 30s Bedside eval Patient unresponsive, hr 23 No palpable pulse - cpr started Atropine Epinephrine x4 Bicarb x1  Massive amount of emesis with suspected aspiration  Actually regain weak pulse, remains unresponsive, Family wishes - DNI, no NGT Discussed this course of action may ultimately lead to his death. They understand this and now wish to make him DNR

## 2014-08-21 NOTE — Consult Note (Addendum)
GI Inpatient Consult Note Christena Deem MD  Reason for Consult: Diarrhea   Attending Requesting Consult: Dr. Elpidio Anis  Outpatient Primary Physician: Dr. Dewaine Oats  History of Present Illness: History is obtained from patient's wife and chart patient is near obtunded and unable to give detailed answers to questions. Pedro Carlson is a 74 y.o. male who was admitted to the hospital yesterday with an apparent COPD exacerbation and shortness of breath and diarrhea. He is also on Effient due to a cardiac stent that was placed and around 2010.  Patient's wife states that he was having some small amounts of diarrhea to admission liver that increased after hospitalization. Also had some emesis over the period past day. Noted some abdominal swelling this morning. He does have a history of peptic ulcer disease. Never wife is unsure whether he has had a upper scope done he has never had a colonoscopy. He did not have any nausea or vomiting at home.  There has been a CT scan ordered. He was C. difficile negative. Stool culture is pending. Found to be Hemoccult-positive.  Past Medical History:  Past Medical History  Diagnosis Date  . COPD (chronic obstructive pulmonary disease)   . Diabetes mellitus without complication   . Hypertension   . CAD (coronary artery disease)   . Spinal stenosis   . Tobacco abuse   . Hyperlipidemia     Problem List: Patient Active Problem List   Diagnosis Date Noted  . ARF (acute renal failure) Aug 04, 2014  . Diarrhea 04-Aug-2014  . Dehydration 04-Aug-2014  . COPD exacerbation 2014/08/04  . Vomiting August 04, 2014  . Gastroenteritis August 04, 2014    Past Surgical History: Past Surgical History  Procedure Laterality Date  . Heart stent    . Cardiac catheterization      Allergies: No Known Allergies  Home Medications: Prescriptions prior to admission  Medication Sig Dispense Refill Last Dose  . albuterol (PROVENTIL HFA;VENTOLIN HFA) 108 (90 BASE) MCG/ACT inhaler  Inhale 1 puff into the lungs every 6 (six) hours as needed for shortness of breath.   PRN at PRN  . aspirin EC 81 MG tablet Take 81 mg by mouth daily.   07/23/2014 at Unknown time  . diazepam (VALIUM) 10 MG tablet Take 10 mg by mouth at bedtime.   07/23/2014 at Unknown time  . insulin glargine (LANTUS) 100 UNIT/ML injection Inject 7 Units into the skin at bedtime.   07/23/2014 at Unknown time  . lisinopril (PRINIVIL,ZESTRIL) 20 MG tablet Take 20 mg by mouth daily.   07/23/2014 at Unknown time  . loratadine (CLARITIN) 10 MG tablet Take 10 mg by mouth daily.   07/23/2014 at Unknown time  . metFORMIN (GLUCOPHAGE) 1000 MG tablet Take 1,000 mg by mouth 2 (two) times daily.   07/23/2014 at Unknown time  . metoprolol (LOPRESSOR) 50 MG tablet Take 50 mg by mouth 2 (two) times daily.   07/23/2014 at 2100  . montelukast (SINGULAIR) 10 MG tablet Take 10 mg by mouth daily.   07/23/2014 at Unknown time  . omeprazole (PRILOSEC) 20 MG capsule Take 20 mg by mouth daily.   07/23/2014 at Unknown time  . prasugrel (EFFIENT) 10 MG TABS tablet Take 10 mg by mouth daily.   07/23/2014 at Unknown time  . simvastatin (ZOCOR) 40 MG tablet Take 40 mg by mouth daily.   07/23/2014 at Unknown time  . Umeclidinium-Vilanterol 62.5-25 MCG/INH AEPB Inhale 1 puff into the lungs daily.   07/23/2014 at Unknown time   Home  medication reconciliation was completed with the patient.   Scheduled Inpatient Medications:   . aspirin EC  81 mg Oral Daily  . diazepam  10 mg Oral QHS  . insulin aspart  0-15 Units Subcutaneous TID WC  . insulin aspart  0-5 Units Subcutaneous QHS  . insulin glargine  7 Units Subcutaneous QHS  . ipratropium-albuterol  3 mL Nebulization Q6H  . loratadine  10 mg Oral Daily  . methylPREDNISolone (SOLU-MEDROL) injection  60 mg Intravenous Q24H  . metoprolol  50 mg Oral BID  . montelukast  10 mg Oral Daily  . pantoprazole  40 mg Oral Daily  . simvastatin  40 mg Oral Daily  . Umeclidinium-Vilanterol  1 puff Inhalation Daily     Continuous Inpatient Infusions:   . sodium chloride 100 mL/hr at 08/08/2014 1227  . sodium chloride      PRN Inpatient Medications:  acetaminophen **OR** acetaminophen, HYDROcodone-acetaminophen, loperamide, morphine injection, ondansetron **OR** ondansetron (ZOFRAN) IV, sodium chloride  Family History: family history includes CVA in his father.   GI Family History: Negative for colorectal cancer liver disease or ulcers.  Social History:   reports that he has been smoking Cigarettes.  He has been smoking about 0.50 packs per day. He does not have any smokeless tobacco history on file. He reports that he does not drink alcohol or use illicit drugs. The patient denies ETOH, tobacco, or drug use.   ROS  Review of Systems:   Physical Examination: BP 154/92 mmHg  Pulse 60  Temp(Src) 97.7 F (36.5 C) (Axillary)  Resp 18  Ht 6' (1.829 m)  Wt 64.501 kg (142 lb 3.2 oz)  BMI 19.28 kg/m2  SpO2 87% Gen: Elderly-appearing 31-year-old Caucasian male. Unable to respond to questions. diaphoretic and pale HEENT: Normocephalic atraumatic, false teeth Neck: No JVD no lymphadenopathy Chest: Clear To auscultation CV: Regular rate and rhythm Abd: Mildly protuberant. He is nontender and relatively soft. Bowel sounds are stent but positive Ext: No clubbing cyanosis or edema Skin: As noted diaphoretic and pale Other:  Data: Lab Results  Component Value Date   WBC 12.2* 07/24/2014   HGB 12.3* 08/20/2014   HCT 39.9* 07/29/2014   MCV 91.6 08/04/2014   PLT 258 08/10/2014    Recent Labs Lab August 09, 2014 1132 07/30/2014 0507 07/31/2014 1353  HGB 14.3 12.8* 12.3*   Lab Results  Component Value Date   NA 137 08/10/2014   K 4.2 07/28/2014   CL 92* 08/05/2014   CO2 26 08/14/2014   BUN 88* 08/17/2014   CREATININE 3.60* 07/30/2014   No results found for: ALT, AST, GGT, ALKPHOS, BILITOT No results for input(s): APTT, INR, PTT in the last 168 hours. CBC Latest Ref Rng 08/04/2014 08/12/2014  08/09/2014  WBC 3.8 - 10.6 K/uL - 12.2(H) 9.3  Hemoglobin 13.0 - 18.0 g/dL 12.3(L) 12.8(L) 14.3  Hematocrit 40.0 - 52.0 % - 39.9(L) 44.9  Platelets 150 - 440 K/uL - 258 287    STUDIES: Dg Chest 2 View  Aug 09, 2014   CLINICAL DATA:  Shortness of breath.  EXAM: CHEST  2 VIEW  COMPARISON:  None.  FINDINGS: The heart size and mediastinal contours are within normal limits. Both lungs are clear. No pneumothorax or pleural effusion is noted. 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  IMPRESSION: No active cardiopulmonary disease.   Electronically Signed   By: Lupita Raider, M.D.   On: 08-09-14 13:02   US Renal  08/16/2014   CLINICAL DATA:  Acute renal failure.  EXAM: RENAL / URINARY TRACT ULTRASOUND COMPLETE  COMPARISON:  None.  FINDINGS: Right Kidney:  Length: 7.7 cm. Echogenicity within normal limits. No mass or hydronephrosis visualized.  Left Kidney:  Length: 8.5 cm. Echogenicity within normal limits. No mass or hydronephrosis visualized.  Bladder:  Appears normal for degree of bladder distention.  Visualized portions of the liver show are heterogeneous in echotexture with areas of increased echogenicity. The etiology is unclear. Consider followup liver imaging. Cysts is noted in the visualized spleen along its inferior margin.  IMPRESSION: 1. Relatively small kidneys. Normal parenchymal echogenicity. No renal masses. No hydronephrosis. 2. Abnormal appearance of the liver. Consider follow-up CT scan of the abdomen with contrast for further evaluation versus liver MRI with and without contrast.   Electronically Signed   By: Amie Portlandavid  Ormond M.D.   On: 07/29/2014 11:52   Dg Chest Port 1 View  08/09/2014   CLINICAL DATA:  Shortness of breath.  History of COPD.  EXAM: PORTABLE CHEST - 1 VIEW  COMPARISON:  06/28/14  FINDINGS: Cardiac silhouette normal in size and configuration. No mediastinal or hilar masses or evidence of adenopathy.  Lungs hyperexpanded but clear. No pleural effusion  or pneumothorax. Bony thorax is grossly intact.  IMPRESSION: 1. No acute cardiopulmonary disease. 2. COPD.   Electronically Signed   By: Amie Portlandavid  Ormond M.D.   On: 07/23/2014 11:46   @IMAGES @  Assessment: #1 she is admitted with COPD exacerbation and diarrhea. Diarrhea has been worsening. Rectal examination this is watery black in color area he was noted be Hemoccult-positive on testing earlier today. He has been acutely changing in his status over the period past hour or so. I've requested transfer plans to be changed to the ICU two-way abdominal film stat on arrival and hemoglobin stat.  Recommendations: As noted above will follow with you.  Thank you for the consult. Please call with questions or concerns.  Christena DeemSKULSKIE, Savir Blanke U, MD  08/15/2014 2:28 PM     Addendum: As discussed with Dr. Juliene PinaMody in the ICU. She currently awaiting central line placement. She will likely be placed on pressors and once stabilized commence CT scan chest abdomen pelvis.

## 2014-08-21 NOTE — Progress Notes (Addendum)
Pt is having severe continuous diarrhea. Also positive for blood. Will request GI to see pt. Afebrile. Normal WBC. With elevated AG expect lactic acid to be elevated which is pending. Will get CT abdomen without contrast. Cancel renal US.    12 PM  [place] lactic acid, US, CXR> Bolus NS and start continuous fluids. I/Os. Await Ct abd. Discussed with Dr. Marva PandaSkulskie. Stop ASA, Effient, Lovenox. PCI > 3 yrs back.

## 2014-08-21 NOTE — Progress Notes (Signed)
MD notified that stool is positive for occult blood, and that pt has continuous diarrhea. MD acknowledged, okay to make chest x ray portable. Verbal order.

## 2014-08-21 DEATH — deceased

## 2016-04-04 IMAGING — CR DG CHEST 2V
1 series · 2 of 2 positions shown · non-contrast
Comparison: None.

CLINICAL DATA: Shortness of breath.

EXAM:
CHEST  2 VIEW

[Series 1: dg chest 2 view · 0.14mm/px · 2 of 2 slices shown]
[im 1/2]
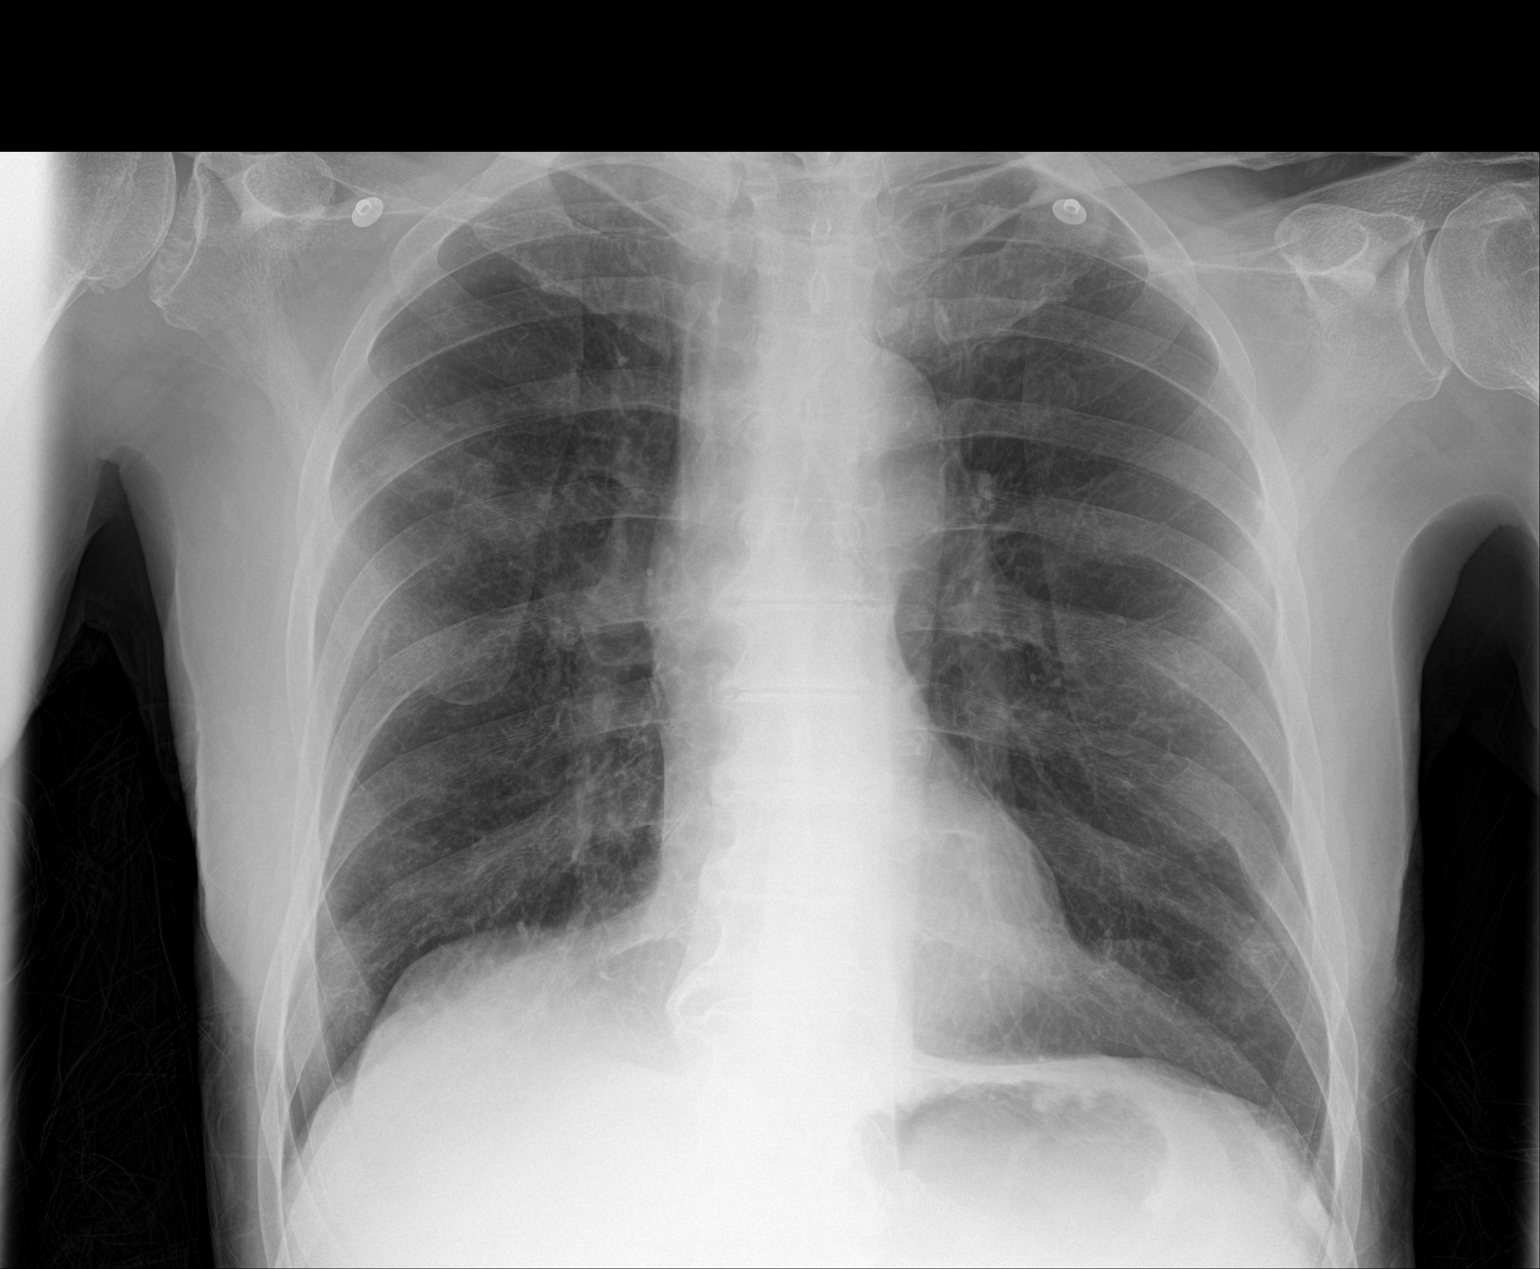
[im 2/2]
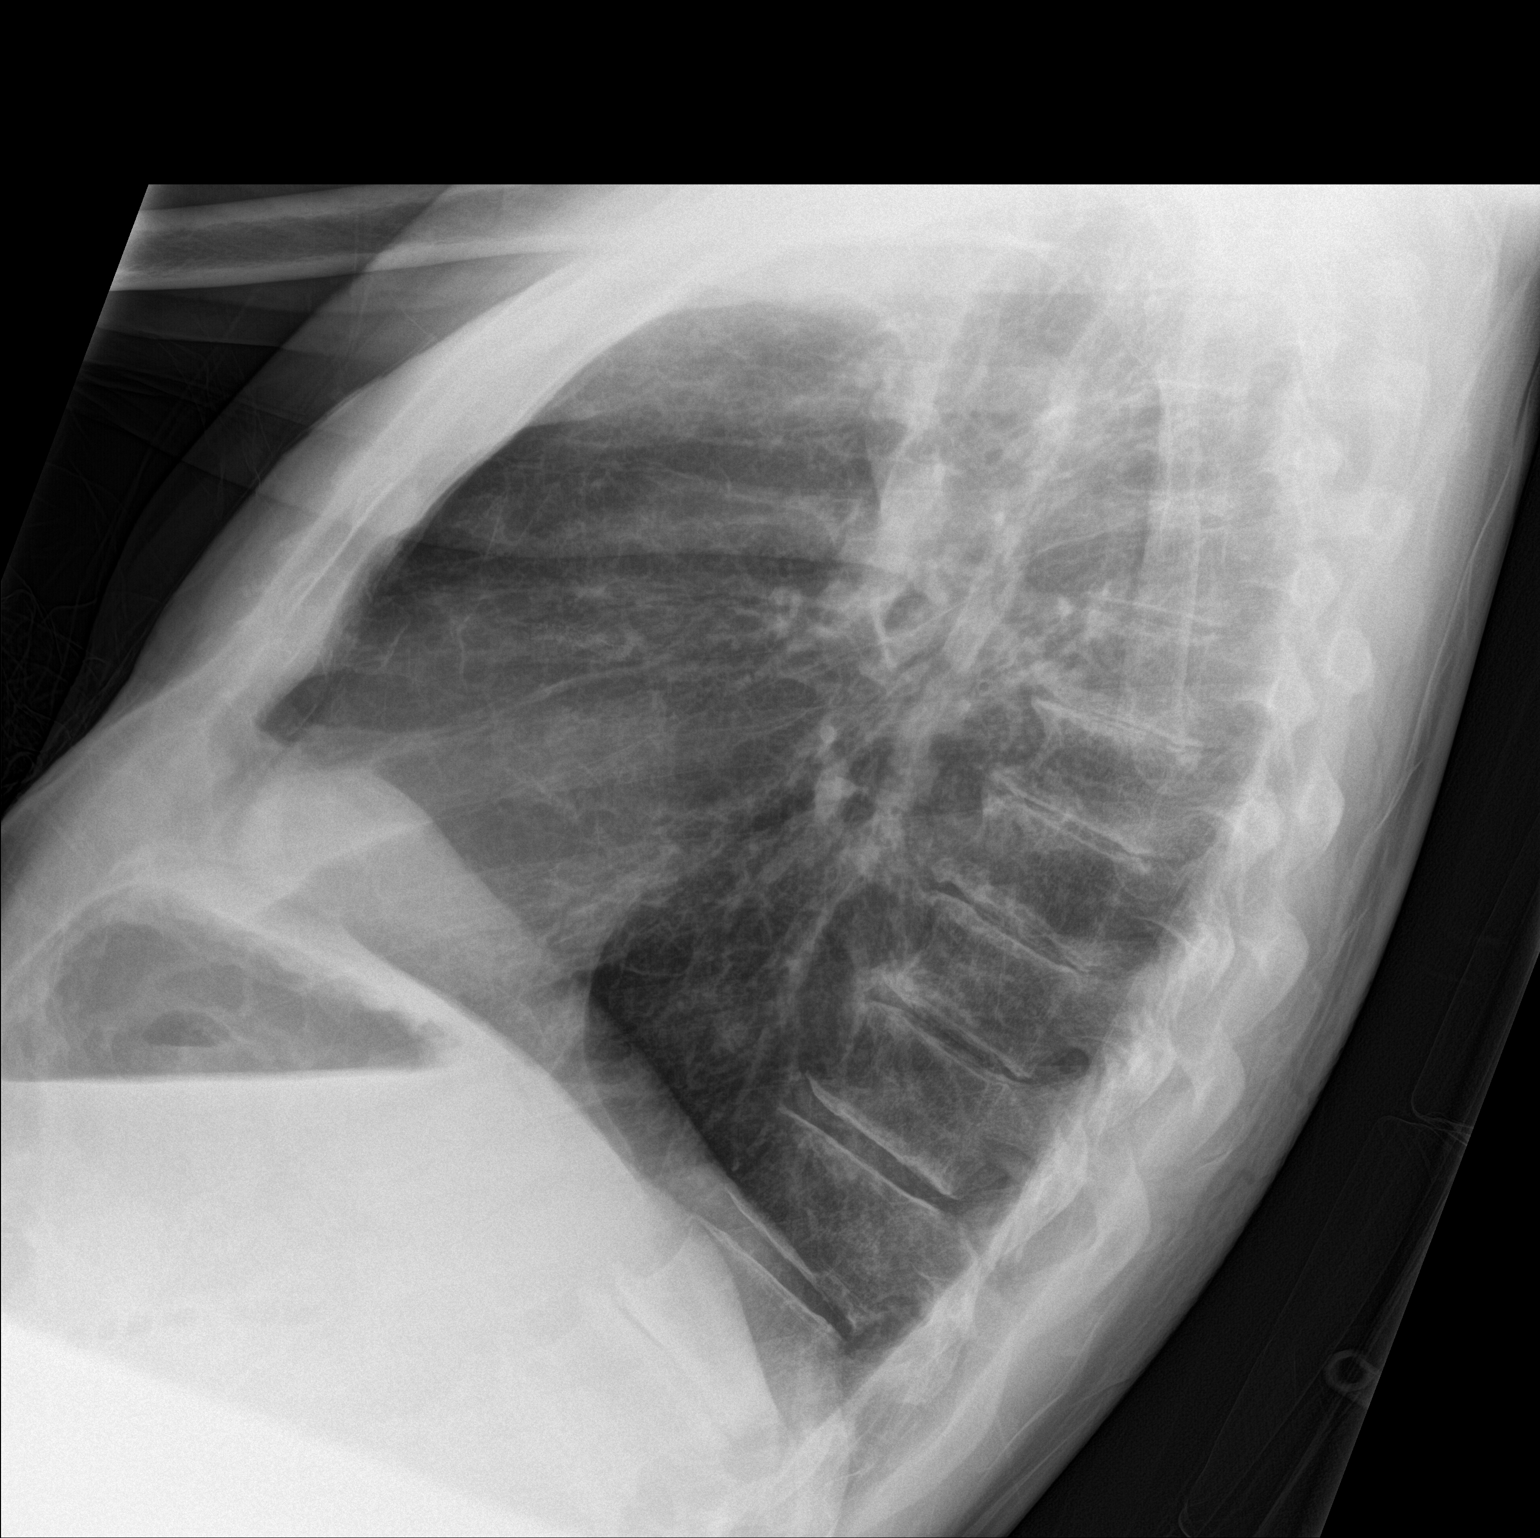

[2 of 2 positions shown; findings below may reference images not displayed]

FINDINGS: The heart size and mediastinal contours are within normal limits.
Both lungs are clear. No pneumothorax or pleural effusion is noted.
Multilevel degenerative disc disease is noted in mid thoracic spine.
IMPRESSION: No active cardiopulmonary disease.

## 2016-04-05 IMAGING — CR DG CHEST 1V PORT
1 series · 1 of 1 positions shown · non-contrast
Comparison: 07/25/2014

CLINICAL DATA: Lower GI bleed.  Tachypnea.

EXAM:
PORTABLE CHEST - 1 VIEW

[ap]
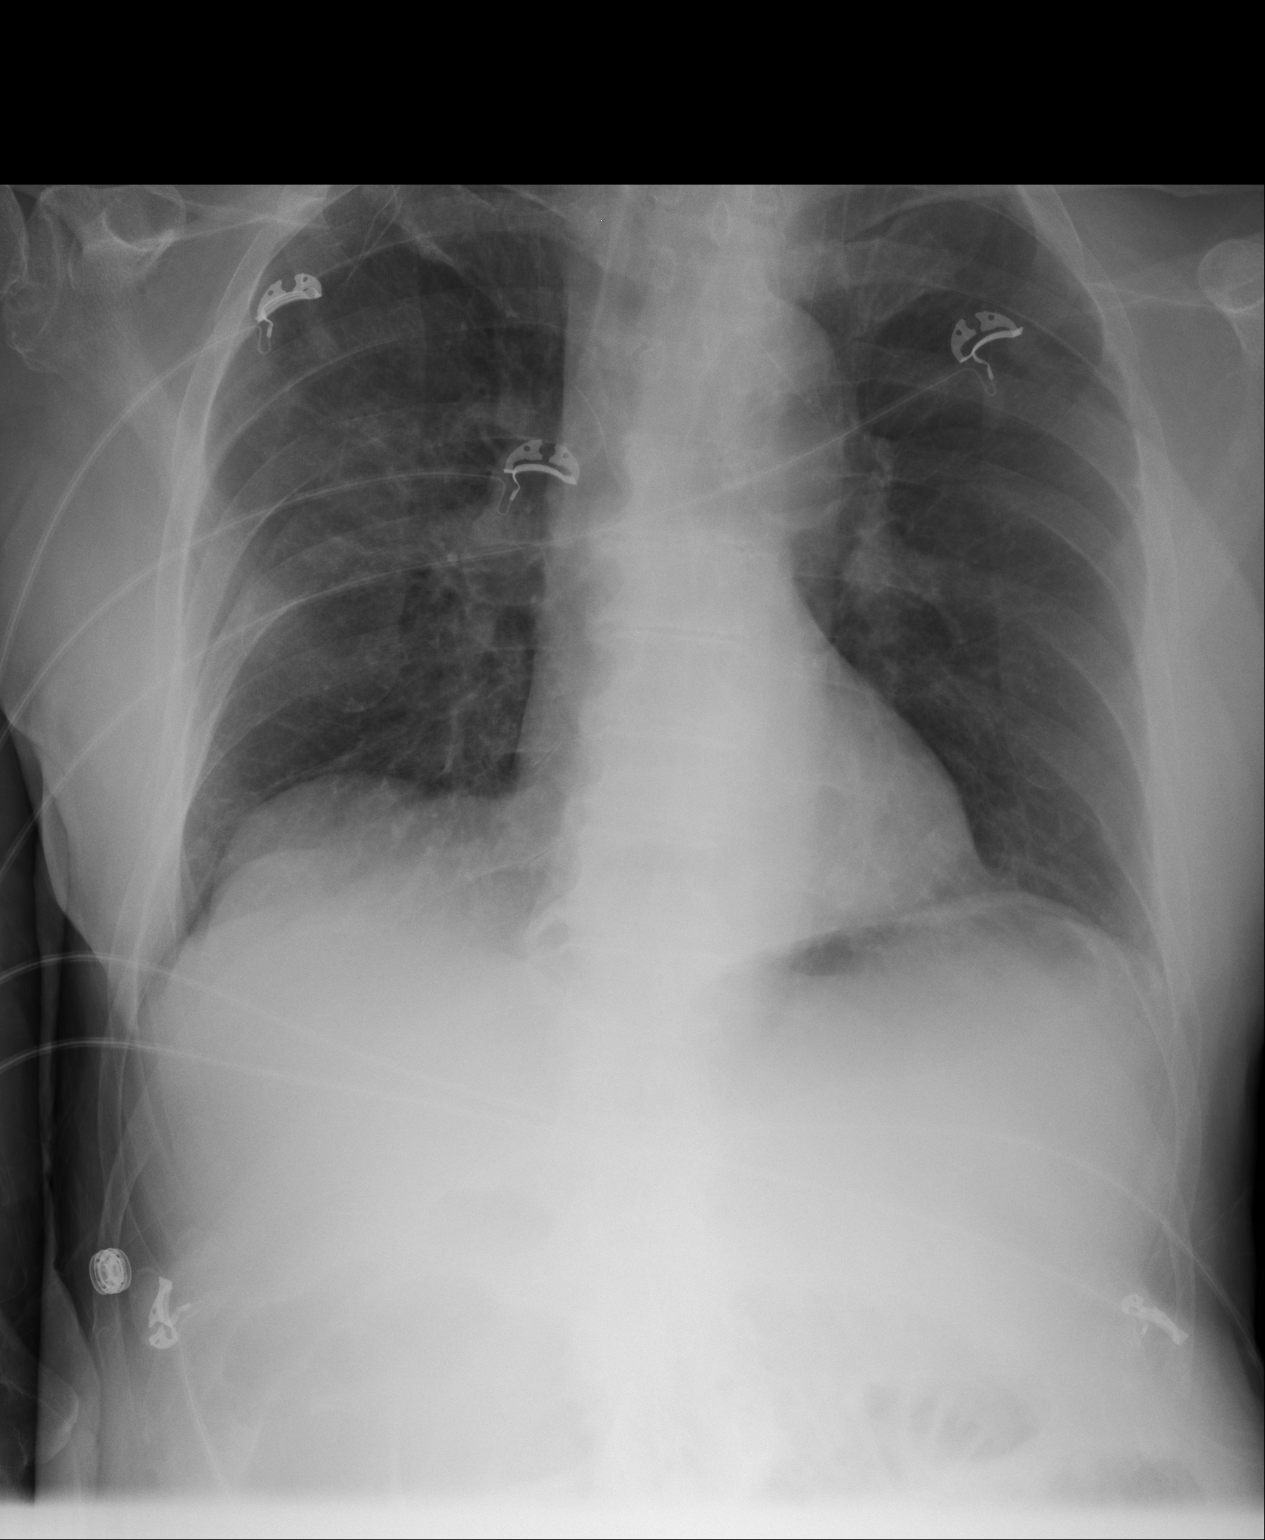

[1 of 1 positions shown; findings below may reference images not displayed]

FINDINGS: There is vague haziness in the right midzone which could represent a
small infiltrate. Lungs are otherwise clear except for chronic
bilateral apical pleural thickening. Heart size and vascularity are
normal. Slight tortuosity and calcification of the thoracic aorta.
IMPRESSION: Slight haziness in the right midzone which could represent a small
infiltrate.
